# Patient Record
Sex: Male | Born: 1970 | Race: White | Hispanic: No | Marital: Married | Smoking: Never smoker
Health system: Southern US, Community
[De-identification: ages and names within clinical notes are randomized; demographics above are authoritative.]

## PROBLEM LIST (undated history)

## (undated) DIAGNOSIS — R9431 Abnormal electrocardiogram [ECG] [EKG]: Secondary | ICD-10-CM

## (undated) DIAGNOSIS — F419 Anxiety disorder, unspecified: Secondary | ICD-10-CM

## (undated) DIAGNOSIS — E785 Hyperlipidemia, unspecified: Secondary | ICD-10-CM

## (undated) DIAGNOSIS — D229 Melanocytic nevi, unspecified: Secondary | ICD-10-CM

## (undated) DIAGNOSIS — I10 Essential (primary) hypertension: Secondary | ICD-10-CM

## (undated) DIAGNOSIS — G4730 Sleep apnea, unspecified: Secondary | ICD-10-CM

## (undated) HISTORY — DX: Hyperlipidemia, unspecified: E78.5

## (undated) HISTORY — PX: FINGER SURGERY: SHX640

## (undated) HISTORY — DX: Anxiety disorder, unspecified: F41.9

## (undated) HISTORY — DX: Melanocytic nevi, unspecified: D22.9

## (undated) HISTORY — DX: Abnormal electrocardiogram (ECG) (EKG): R94.31

## (undated) HISTORY — DX: Essential (primary) hypertension: I10

## (undated) HISTORY — PX: TOOTH EXTRACTION: SUR596

## (undated) HISTORY — DX: Sleep apnea, unspecified: G47.30

---

## 2002-07-15 HISTORY — PX: VASECTOMY: SHX75

## 2003-11-30 ENCOUNTER — Encounter: Admission: RE | Admit: 2003-11-30 | Discharge: 2003-11-30 | Payer: Self-pay | Admitting: Family Medicine

## 2006-07-30 ENCOUNTER — Ambulatory Visit: Payer: Self-pay | Admitting: Family Medicine

## 2006-08-22 ENCOUNTER — Ambulatory Visit: Payer: Self-pay | Admitting: Family Medicine

## 2007-05-20 ENCOUNTER — Encounter (INDEPENDENT_AMBULATORY_CARE_PROVIDER_SITE_OTHER): Payer: Self-pay | Admitting: Family Medicine

## 2007-07-02 ENCOUNTER — Telehealth (INDEPENDENT_AMBULATORY_CARE_PROVIDER_SITE_OTHER): Payer: Self-pay | Admitting: Family Medicine

## 2007-07-06 ENCOUNTER — Ambulatory Visit: Payer: Self-pay | Admitting: Family Medicine

## 2007-09-02 ENCOUNTER — Ambulatory Visit: Payer: Self-pay | Admitting: Family Medicine

## 2007-09-02 ENCOUNTER — Encounter (INDEPENDENT_AMBULATORY_CARE_PROVIDER_SITE_OTHER): Payer: Self-pay | Admitting: *Deleted

## 2007-09-02 LAB — CONVERTED CEMR LAB: Direct LDL: 169.7 mg/dL

## 2007-09-16 ENCOUNTER — Ambulatory Visit: Payer: Self-pay | Admitting: Family Medicine

## 2008-04-06 ENCOUNTER — Ambulatory Visit: Payer: Self-pay | Admitting: Internal Medicine

## 2008-04-06 DIAGNOSIS — M549 Dorsalgia, unspecified: Secondary | ICD-10-CM

## 2008-04-06 HISTORY — DX: Dorsalgia, unspecified: M54.9

## 2008-05-19 ENCOUNTER — Ambulatory Visit: Payer: Self-pay | Admitting: Internal Medicine

## 2008-05-19 DIAGNOSIS — F411 Generalized anxiety disorder: Secondary | ICD-10-CM

## 2008-05-19 DIAGNOSIS — R9431 Abnormal electrocardiogram [ECG] [EKG]: Secondary | ICD-10-CM

## 2008-05-19 DIAGNOSIS — F419 Anxiety disorder, unspecified: Secondary | ICD-10-CM | POA: Insufficient documentation

## 2008-05-19 DIAGNOSIS — I10 Essential (primary) hypertension: Secondary | ICD-10-CM

## 2008-05-19 HISTORY — DX: Essential (primary) hypertension: I10

## 2008-05-19 HISTORY — DX: Generalized anxiety disorder: F41.1

## 2008-05-23 ENCOUNTER — Telehealth (INDEPENDENT_AMBULATORY_CARE_PROVIDER_SITE_OTHER): Payer: Self-pay | Admitting: *Deleted

## 2008-05-25 ENCOUNTER — Encounter (INDEPENDENT_AMBULATORY_CARE_PROVIDER_SITE_OTHER): Payer: Self-pay | Admitting: *Deleted

## 2008-05-30 ENCOUNTER — Telehealth (INDEPENDENT_AMBULATORY_CARE_PROVIDER_SITE_OTHER): Payer: Self-pay | Admitting: *Deleted

## 2008-06-13 ENCOUNTER — Ambulatory Visit: Payer: Self-pay | Admitting: Cardiovascular Disease

## 2008-06-23 ENCOUNTER — Ambulatory Visit: Payer: Self-pay

## 2008-06-23 ENCOUNTER — Encounter: Payer: Self-pay | Admitting: Cardiovascular Disease

## 2008-07-01 ENCOUNTER — Ambulatory Visit: Payer: Self-pay | Admitting: Cardiovascular Disease

## 2008-08-16 ENCOUNTER — Ambulatory Visit: Payer: Self-pay | Admitting: Internal Medicine

## 2008-08-18 LAB — CONVERTED CEMR LAB
Cholesterol: 256 mg/dL (ref 0–200)
HDL: 84.4 mg/dL (ref >39.0)
Triglycerides: 75 mg/dL (ref 0–149)

## 2008-09-08 ENCOUNTER — Ambulatory Visit: Payer: Self-pay | Admitting: Internal Medicine

## 2008-09-13 ENCOUNTER — Telehealth (INDEPENDENT_AMBULATORY_CARE_PROVIDER_SITE_OTHER): Payer: Self-pay | Admitting: *Deleted

## 2008-09-13 LAB — CONVERTED CEMR LAB
CO2: 30 meq/L (ref 19–32)
Calcium: 9.4 mg/dL (ref 8.4–10.5)
Creatinine, Ser: 0.9 mg/dL (ref 0.4–1.5)
GFR calc non Af Amer: 101 mL/min
Glucose, Bld: 102 mg/dL — ABNORMAL HIGH (ref 70–99)
Sodium: 143 meq/L (ref 135–145)

## 2008-11-16 ENCOUNTER — Encounter (INDEPENDENT_AMBULATORY_CARE_PROVIDER_SITE_OTHER): Payer: Self-pay | Admitting: *Deleted

## 2008-12-13 ENCOUNTER — Ambulatory Visit: Payer: Self-pay | Admitting: Internal Medicine

## 2009-02-01 ENCOUNTER — Ambulatory Visit: Payer: Self-pay | Admitting: Family Medicine

## 2009-06-05 ENCOUNTER — Ambulatory Visit: Payer: Self-pay | Admitting: Internal Medicine

## 2009-06-05 DIAGNOSIS — D239 Other benign neoplasm of skin, unspecified: Secondary | ICD-10-CM

## 2009-06-05 HISTORY — DX: Other benign neoplasm of skin, unspecified: D23.9

## 2009-06-07 ENCOUNTER — Ambulatory Visit: Payer: Self-pay | Admitting: Internal Medicine

## 2009-06-14 ENCOUNTER — Encounter: Payer: Self-pay | Admitting: Internal Medicine

## 2009-06-15 ENCOUNTER — Ambulatory Visit: Payer: Self-pay | Admitting: Cardiovascular Disease

## 2009-06-15 DIAGNOSIS — R9389 Abnormal findings on diagnostic imaging of other specified body structures: Secondary | ICD-10-CM

## 2009-06-15 DIAGNOSIS — I4510 Unspecified right bundle-branch block: Secondary | ICD-10-CM

## 2009-06-15 HISTORY — DX: Unspecified right bundle-branch block: I45.10

## 2009-06-15 HISTORY — DX: Abnormal findings on diagnostic imaging of other specified body structures: R93.89

## 2009-06-15 LAB — CONVERTED CEMR LAB
AST: 23 units/L (ref 0–37)
BUN: 11 mg/dL (ref 6–23)
Basophils Absolute: 0 10*3/uL (ref 0.0–0.1)
Chloride: 103 meq/L (ref 96–112)
Creatinine, Ser: 1 mg/dL (ref 0.4–1.5)
Direct LDL: 166.6 mg/dL
HDL: 79.9 mg/dL (ref >39.00)
Lymphocytes Relative: 24.9 % (ref 12.0–46.0)
MCHC: 34.5 g/dL (ref 30.0–36.0)
MCV: 93.9 fL (ref 78.0–100.0)
Neutro Abs: 2.6 10*3/uL (ref 1.4–7.7)
Platelets: 177 10*3/uL (ref 150.0–400.0)
RDW: 11.6 % (ref 11.5–14.6)
TSH: 2.31 microintl units/mL (ref 0.35–5.50)
WBC: 4.2 10*3/uL — ABNORMAL LOW (ref 4.5–10.5)

## 2009-06-27 ENCOUNTER — Encounter: Payer: Self-pay | Admitting: Cardiovascular Disease

## 2009-06-27 ENCOUNTER — Ambulatory Visit: Payer: Self-pay

## 2009-06-27 ENCOUNTER — Ambulatory Visit: Payer: Self-pay | Admitting: Cardiovascular Disease

## 2009-06-27 ENCOUNTER — Ambulatory Visit (HOSPITAL_COMMUNITY): Admission: RE | Admit: 2009-06-27 | Discharge: 2009-06-27 | Payer: Self-pay | Admitting: Internal Medicine

## 2009-07-18 ENCOUNTER — Telehealth: Payer: Self-pay | Admitting: Cardiovascular Disease

## 2009-12-15 ENCOUNTER — Telehealth (INDEPENDENT_AMBULATORY_CARE_PROVIDER_SITE_OTHER): Payer: Self-pay | Admitting: *Deleted

## 2009-12-28 ENCOUNTER — Encounter (INDEPENDENT_AMBULATORY_CARE_PROVIDER_SITE_OTHER): Payer: Self-pay | Admitting: *Deleted

## 2010-02-21 ENCOUNTER — Ambulatory Visit: Payer: Self-pay | Admitting: Internal Medicine

## 2010-03-23 ENCOUNTER — Ambulatory Visit: Payer: Self-pay | Admitting: Internal Medicine

## 2010-06-19 ENCOUNTER — Encounter: Payer: Self-pay | Admitting: Cardiovascular Disease

## 2010-06-19 ENCOUNTER — Ambulatory Visit: Payer: Self-pay | Admitting: Cardiovascular Disease

## 2010-08-12 LAB — CONVERTED CEMR LAB
Basophils Absolute: 0 10*3/uL (ref 0.0–0.1)
Basophils Relative: 0.4 % (ref 0.0–3.0)
Direct LDL: 183.5 mg/dL
Eosinophils Absolute: 0 10*3/uL (ref 0.0–0.7)
GFR calc non Af Amer: 101 mL/min
Glucose, Bld: 92 mg/dL (ref 70–99)
HDL: 65.4 mg/dL (ref >39.0)
Hemoglobin: 13.9 g/dL (ref 13.0–17.0)
Lymphocytes Relative: 20 % (ref 12.0–46.0)
Monocytes Absolute: 0.4 10*3/uL (ref 0.1–1.0)
Monocytes Relative: 6.9 % (ref 3.0–12.0)
Neutro Abs: 4.6 10*3/uL (ref 1.4–7.7)
Neutrophils Relative %: 71.9 % (ref 43.0–77.0)
Total CHOL/HDL Ratio: 4
Triglycerides: 53 mg/dL (ref 0–149)
VLDL: 11 mg/dL (ref 0–40)
WBC: 6.2 10*3/uL (ref 4.5–10.5)

## 2010-08-14 NOTE — Assessment & Plan Note (Signed)
Summary: 1 yr   Visit Type:  1 yr f/u Primary Provider:  Nolon Rod. Paz MD  CC:  denies any cardiac complaints today.  History of Present Illness: Mr. Troy Leonard is a pleasant 40 year old Caucasian male with a past medical history significant for anxiety, borderline hyperlipidemia and borderline hypertension who was initially seen in our office on June 13, 2008 for further evaluation of an abnormal EKG.  The patient at that time denied having any chest pain, but did complain of occasional shortness of breath when he was having episodes of anxiety.  His EKG in our office showed a normal conduction pattern, but did show  left ventricular hypertrophy. Echo showed mild LV and RV dilation, normal LV function,  trivial AI. I last saw him in December 2010.  Echo at that time showed normal LV and RV size and function. He returns today for follow up. He has no complaints. He denies SOB or chest pain. He has been exercising daily.   Current Medications (verified): 1)  Lotensin 20 Mg Tabs (Benazepril Hcl) .Marland Kitchen.. 1 By Mouth Once Daily 2)  Naproxen 500 Mg Tabs (Naproxen) .Marland Kitchen.. 1 By Mouth Two Times A Day With Food  As Needed For Pain 3)  Flexeril 10 Mg Tabs (Cyclobenzaprine Hcl) .Marland Kitchen.. 1 By Mouth At Bedtime As Needed Pain 4)  Clonazepam 2 Mg Tabs (Clonazepam) .Marland Kitchen.. 1 Tab Once Daily 5)  Multivitamins   Tabs (Multiple Vitamin) .Marland Kitchen.. 1 Tab Once Daily 6)  Fish Oil 1000 Mg Caps (Omega-3 Fatty Acids) .... 2-3 Caps Once Daily 7)  Aspirin 81 Mg Tbec (Aspirin) .... As Needed 8)  Glucosamine 500 Mg Caps (Glucosamine Sulfate) .... 2 Caps Once Daily  Allergies (verified): No Known Drug Allergies  Past History:  Past Medical History: ? of HYPERLIPIDEMIA, WITH HIGH HDL   Anxiety sees [name] 11-09 EKG LVH, saw cards , ECHO done ( see report), repeat ECHO 05-2009 NEVI, MULTIPLE   HYPERTENSION      Social History: Reviewed history from 06/13/2008 and no changes required. used to play soccer at [employer] Married 2 kids  No tobacco No illicit drugs Daily wine-2-3 glasses employed as a Public affairs consultant  Review of Systems  The patient denies fatigue, malaise, fever, weight gain/loss, vision loss, decreased hearing, hoarseness, chest pain, palpitations, shortness of breath, prolonged cough, wheezing, sleep apnea, coughing up blood, abdominal pain, blood in stool, nausea, vomiting, diarrhea, heartburn, incontinence, blood in urine, muscle weakness, joint pain, leg swelling, rash, skin lesions, headache, fainting, dizziness, depression, anxiety, enlarged lymph nodes, easy bruising or bleeding, and environmental allergies.    Vital Signs:  Patient profile:   40 year old male Height:      74 inches Weight:      189 pounds BMI:     24.35 Pulse rate:   77 / minute Pulse rhythm:   regular BP sitting:   122 / 70  (left arm) Cuff size:   large  Vitals Entered By: Danielle Rankin, CMA (June 19, 2010 8:44 AM)  Physical Exam  General:  General: Well developed, well nourished, NAD Musculoskeletal: Muscle strength 5/5 all ext Psychiatric: Mood and affect normal Neck: No JVD, no carotid bruits, no thyromegaly, no lymphadenopathy. Lungs:Clear bilaterally, no wheezes, rhonci, crackles CV: RRR no murmurs, gallops rubs Abdomen: soft, NT, ND, BS present Extremities: No edema, pulses 2+.    EKG  Procedure date:  06/19/2010  Findings:      NSR, rate 77 bpm. incomplete RBBB  Impression & Recommendations:  Problem # 1:  RBBB (ICD-426.4) Normal LV and RV by echo one year ago. I will see him back in 2 years and repeat echo after that.   His updated medication list for this problem includes:    Lotensin 20 Mg Tabs (Benazepril hcl) .Marland Kitchen... 1 by mouth once daily    Aspirin 81 Mg Tbec (Aspirin) .Marland Kitchen... As needed  Problem # 2:  HYPERTENSION (ICD-401.9) BP well controlled. No change in therapy.   His updated medication list for this problem includes:    Lotensin 20 Mg Tabs  (Benazepril hcl) .Marland Kitchen... 1 by mouth once daily    Aspirin 81 Mg Tbec (Aspirin) .Marland Kitchen... As needed  Patient Instructions: 1)  Your physician recommends that you schedule a follow-up appointment in: 2 years 2)  Your physician recommends that you continue on your current medications as directed. Please refer to the Current Medication list given to you today.

## 2010-08-14 NOTE — Consult Note (Signed)
Summary: moles, Rx observation--- Dermatology  Duard Larsen MD Dermatology   Imported By: Lanelle Bal 08/24/2009 15:40:06  _____________________________________________________________________  External Attachment:    Type:   Image     Comment:   External Document

## 2010-08-14 NOTE — Progress Notes (Signed)
Summary: lab appt-lm  ---- Converted from flag ---- ---- 11/30/2009 10:39 AM, Shary Decamp wrote: needs nmr ------------------------------  Phone Note Outgoing Call   Call placed by: Doristine Devoid,  December 15, 2009 10:30 AM Call placed to: Patient Summary of Call: left msg w/ wife to have patient return call needs lab appt for NMR per last lab from 06/2009  Follow-up for Phone Call        mailed .....Marland KitchenMarland KitchenDoristine Devoid  December 28, 2009 3:10 PM

## 2010-08-14 NOTE — Letter (Signed)
Summary: Primary Care Appointment Letter  [facility] [place]  [address] [place], [place] [address]   Phone: [phone]  Fax: [phone]    12/28/2009 MRN: [id number]  Troy Leonard [address] [place], [place]  [address]  Dear Mr. Troy Leonard,   Your Primary Care Physician Somerville E. Paz MD has indicated that:    _______it is time to schedule an appointment.    _______you missed your appointment on______ and need to call and          reschedule.    ___X____you need to have lab work done for fasting NMR.    _______you need to schedule an appointment discuss lab or test results.    _______you need to call to reschedule your appointment that is                       scheduled on _________.     Please call our office as soon as possible. Our phone number is [phone]_____. Our office is open 8a-5p, Monday through Friday.     Thank you,     Primary Care Scheduler

## 2010-08-14 NOTE — Progress Notes (Signed)
Summary: returning call   Phone Note Call from Patient Call back at Work Phone [phone]   Caller: Patient Reason for Call: Talk to Nurse Summary of Call: returning call Initial call taken by: Migdalia Dk,  July 18, 2009 10:09 AM  Follow-up for Phone Call        LM on private cell phone with results of normal echo. Follow-up by: Suzan Garibaldi RN

## 2010-08-14 NOTE — Assessment & Plan Note (Signed)
Summary: back pain/cbs   Vital Signs:  Patient profile:   40 year old male Height:      74 inches Weight:      184.38 pounds BMI:     23.76 Pulse rate:   73 / minute Pulse rhythm:   regular BP sitting:   132 / 80  (left arm) Cuff size:   large  Vitals Entered By: Army Fossa CMA (March 23, 2010 12:40 PM) CC: Lower back pain Comments x 4-6 weeks concerend about a pinched nerve. [facility] [address]   History of Present Illness: has back pain from time to time, current symptoms started a few weeks ago after he play  soccer Pain is mostly on the left side of the lower back, somehow radiates to both legs, lateral aspect. Worse by sitting in the car for long hours  ROS No fever No bladder or bowel incontinence No lower extremity paresthesias No rash  Current Medications (verified): 1)  Lotensin 20 Mg Tabs (Benazepril Hcl) .Marland Kitchen.. 1 By Mouth Once Daily  Allergies (verified): No Known Drug Allergies  Past History:  Past Medical History: ? of HYPERLIPIDEMIA, WITH HIGH HDL   Anxiety sees [name] 11-09 EKG LVH, saw cards , ECHO done ( see report), repeat ECHO 05-2009 NEVI, MULTIPLE   HYPERTENSION  theANXIETY     Physical Exam  General:  alert, well-developed, and well-nourished.   Extremities:  no lower extremity venous Neurologic:  alert & oriented X3, strength normal in all extremities, and gait normal.  DTRs symmetrically decreased. Straight leg test negative   Impression & Recommendations:  Problem # 1:  BACK PAIN (ICD-724.5) episode of back pain for few weeks, no red flag symptoms Conservative treatment, see instructions His updated medication list for this problem includes:    Naproxen 500 Mg Tabs (Naproxen) .Marland Kitchen... 1 by mouth two times a day with food  as needed for pain    Flexeril 10 Mg Tabs (Cyclobenzaprine hcl) .Marland Kitchen... 1 by mouth at bedtime as needed pain  Complete Medication List: 1)  Lotensin 20 Mg Tabs (Benazepril hcl) .Marland Kitchen.. 1 by mouth  once daily 2)  Naproxen 500 Mg Tabs (Naproxen) .Marland Kitchen.. 1 by mouth two times a day with food  as needed for pain 3)  Flexeril 10 Mg Tabs (Cyclobenzaprine hcl) .Marland Kitchen.. 1 by mouth at bedtime as needed pain  Other Orders: Admin 1st Vaccine (04540) Flu Vaccine 33yrs + (98119)  Patient Instructions: 1)  warm compress 2)  naproxen --- watch for stomach irritation 3)  flexeril-- watch for somnolence 4)  call if no better in 2 weeks  Prescriptions: FLEXERIL 10 MG TABS (CYCLOBENZAPRINE HCL) 1 by mouth at bedtime as needed pain  #20 x 0   Entered and Authorized by:   Nolon Rod. Paz MD   Signed by:   Nolon Rod. Paz MD on 03/23/2010   Method used:   [facility]        [facility] [phone]* (retail)       [address]       [place], [place]  [address]       Ph: [phone]       Fax: [phone]   RxID:   [id number] NAPROXEN 500 MG TABS (NAPROXEN) 1 by mouth two times a day with food  as needed for pain  #40 x 0   Entered and Authorized by:   Nolon Rod. Paz MD   Signed by:  Jose E. Paz MD on 03/23/2010   Method used:   [facility]ronically to        [facility] [phone]* (retail)       [address]       [place], [place]  [address]       Ph: [phone]       Fax: [phone]   RxID:   [id number]   Flu Vaccine Consent Questions     Do you have a history of severe allergic reactions to this vaccine? no    Any prior history of allergic reactions to egg and/or gelatin? no    Do you have a sensitivity to the preservative Thimersol? no    Do you have a past history of Guillan-Barre Syndrome? no    Do you currently have an acute febrile illness? no    Have you ever had a severe reaction to latex? no    Vaccine information given and explained to patient? yes    Are you currently pregnant? no    Lot Number:[id number]   Exp Date:01/12/2011   Site Given  Right Deltoid IMu

## 2010-08-20 ENCOUNTER — Encounter: Payer: Self-pay | Admitting: Internal Medicine

## 2010-08-20 ENCOUNTER — Ambulatory Visit (INDEPENDENT_AMBULATORY_CARE_PROVIDER_SITE_OTHER): Payer: Self-pay | Admitting: Internal Medicine

## 2010-08-20 DIAGNOSIS — M6580 Other synovitis and tenosynovitis, unspecified site: Secondary | ICD-10-CM

## 2010-08-23 ENCOUNTER — Telehealth: Payer: Self-pay | Admitting: Internal Medicine

## 2010-08-30 NOTE — Progress Notes (Signed)
Summary: Voltaren not working  Phone Note Call from Patient Call back at Work Phone [phone]   Summary of Call: Patient left message on triage that this is his 3rd day on Voltaren and it is not working. He would like to know if this can be switched to the topical ointment. Please advise. Lucious Groves CMA  August 23, 2010 3:33 PM   Follow-up for Phone Call        ok, see Rx Salvatore Shear E. Jarius Dieudonne MD  August 23, 2010 3:37 PM   Additional Follow-up for Phone Call Additional follow up Details #1::        Left message on voicemail to call back to office. Lucious Groves CMA  August 23, 2010 4:30 PM   Patient notified. Lucious Groves CMA  August 24, 2010 9:08 AM     New/Updated Medications: VOLTAREN 1 % GEL (DICLOFENAC SODIUM) apply 1gr three times a day to each joint as needed Prescriptions: VOLTAREN 1 % GEL (DICLOFENAC SODIUM) apply 1gr three times a day to each joint as needed  #1 month x 3   Entered and Authorized by:   Elita Quick E. Siegfried Vieth MD   Signed by:   Lucious Groves CMA on 08/23/2010   Method used:   [facility] to        [facility] [phone]* (retail)       [address]       [place], [place]  [address]       Ph: [phone]       Fax: [phone]   RxID:   [id number]

## 2010-08-30 NOTE — Assessment & Plan Note (Signed)
Summary: BOTH KNEES PAINFUL///SPH  Nurse Visit   Vital Signs:  Patient profile:   40 year old male Weight:      192.50 pounds Pulse rate:   68 / minute Pulse rhythm:   regular BP sitting:   122 / 84  (left arm) Cuff size:   large  Vitals Entered By: Army Fossa CMA (August 20, 2010 3:39 PM)  History of Present Illness: started to run 11-2009  ~ 2 or 3 miles then in November started to run more  ~ 5 or 6 miles. His knees started to hurt, initiall the L , now both (L>R) pain is at the lateral asprect and on the right side of the frontal lower aspect of the knee voltaren  from his wife helps  ROS denies  swelling No locking or giving away tried  Voltaren without apparent GI  side effects   Physical Exam  General:  alert, well-developed, and well-nourished.   Extremities:  knees are symmetric, no swelling, redness, deformities. Both knees are stable and full range of motion   Impression & Recommendations:  Problem # 1:  TENDINITIS, KNEE (ICD-727.09) symptoms consistent with knee tendinitis  Stretching discussed and encouraged before running NSAIDs ice Recommend to gradually go back to exercise Will call if no better  Complete Medication List: 1)  Lotensin 20 Mg Tabs (Benazepril hcl) .Marland Kitchen.. 1 by mouth once daily 2)  Naproxen 500 Mg Tabs (Naproxen) .Marland Kitchen.. 1 by mouth two times a day with food  as needed for pain 3)  Clonazepam 2 Mg Tabs (Clonazepam) .Marland Kitchen.. 1 tab once daily 4)  Multivitamins Tabs (Multiple vitamin) .Marland Kitchen.. 1 tab once daily 5)  Fish Oil 1000 Mg Caps (Omega-3 fatty acids) .... 2-3 caps once daily 6)  Aspirin 81 Mg Tbec (Aspirin) .... As needed 7)  Glucosamine 500 Mg Caps (Glucosamine sulfate) .... 2 caps once daily 8)  Diclofenac Sodium 50 Mg Tbec (Diclofenac sodium) .Marland Kitchen.. 1 by mouth two times a day as needed pain   Patient Instructions: 1)  stretching before running 2)  ice 3)  voltaren (diclofenac) as needed , take with food  4)  call if not feeling  well    Past History:  Past Medical History: Reviewed history from 06/19/2010 and no changes required. ? of HYPERLIPIDEMIA, WITH HIGH HDL   Anxiety sees [name] 11-09 EKG LVH, saw cards , ECHO done ( see report), repeat ECHO 05-2009 NEVI, MULTIPLE   HYPERTENSION      Past Surgical History: Reviewed history from 06/13/2008 and no changes required. Finger surgery   Social History: Reviewed history from 06/13/2008 and no changes required. used to [place] [employer] Married 2 kids  No tobacco No illicit drugs Daily wine-2-3 glasses employed as a Transport planner for [employer]  CC: Pain in both knees Comments started thanksgiving off and on (L) knee is worse [facility] [address]   Current Medications (verified): 1)  Lotensin 20 Mg Tabs (Benazepril Hcl) .Marland Kitchen.. 1 By Mouth Once Daily 2)  Naproxen 500 Mg Tabs (Naproxen) .Marland Kitchen.. 1 By Mouth Two Times A Day With Food  As Needed For Pain 3)  Clonazepam 2 Mg Tabs (Clonazepam) .Marland Kitchen.. 1 Tab Once Daily 4)  Multivitamins   Tabs (Multiple Vitamin) .Marland Kitchen.. 1 Tab Once Daily 5)  Fish Oil 1000 Mg Caps (Omega-3 Fatty Acids) .... 2-3 Caps Once Daily 6)  Aspirin 81 Mg Tbec (Aspirin) .... As Needed 7)  Glucosamine 500 Mg Caps (Glucosamine Sulfate) .... 2 Caps  Once Daily  Allergies (verified): No Known Drug Allergies  Orders Added: 1)  Est. Patient Level III [[address]] Prescriptions: DICLOFENAC SODIUM 50 MG TBEC (DICLOFENAC SODIUM) 1 by mouth two times a day as needed pain  #60 x 0   Entered and Authorized by:   Nolon Rod. Rorey Hodges MD   Signed by:   Nolon Rod. Mickie Badders MD on 08/20/2010   Method used:   [facility] to        [facility] [phone]* (retail)       [address]       [place], [place]  [address]       Ph: [phone]       Fax: [phone]   RxID:   [id number]

## 2010-09-20 ENCOUNTER — Telehealth: Payer: Self-pay | Admitting: Internal Medicine

## 2010-09-21 ENCOUNTER — Telehealth: Payer: Self-pay | Admitting: Internal Medicine

## 2010-09-25 NOTE — Progress Notes (Addendum)
Summary: Pennsaid directions  Phone Note From Pharmacy   Caller: [facility]  Summary of Call: Pharmacist called and states that Pennsaid comes in ML not grams- please give new directions. Army Fossa CMA  September 21, 2010 2:10 PM 4cc on each knee qid as needed [place] E. Paz MD  September 21, 2010 2:37 PM      New/Updated Medications: PENNSAID 1.5 % SOLN (DICLOFENAC SODIUM) 4cc on each knee qid as needed Prescriptions: PENNSAID 1.5 % SOLN (DICLOFENAC SODIUM) 4cc on each knee qid as needed  #1 x 3   Entered by:   Army Fossa CMA   Authorized by:   Nolon Rod. Paz MD   Signed by:   Army Fossa CMA on 09/21/2010   Method used:   [facility]ically to        [facility] [phone]* (retail)       [address]       [place], [place]  [address]       Ph: [phone]       Fax: [phone]   RxID:   [id number]   Appended Document: [facility]aid [facility] and the pharmacist stated the directions on this RX should be 40 drops to each knee qid and wanted to know if it was ok to change. She stated this is the recommended dose and 4cc's would be 80 drops.   Appended Document: Pennsaid directions ok to change  Appended Document: Pennsaid directions done.

## 2010-09-25 NOTE — Progress Notes (Signed)
Summary: voltaren alt  Phone Note From Pharmacy   Summary of Call: Voltaren gel is on indefenite backorder. Is there an alternative for pt ?  [facility] Initial call taken by: Army Fossa CMA,  September 20, 2010 9:56 AM  Follow-up for Phone Call        i don't believe there is another  prescribed topical NSAID, rec to ask his pharmacyst. ? OTC topical ibuprofen  Follow-up by: Nolon Rod. Francisca Harbuck MD,  September 20, 2010 6:39 PM  Additional Follow-up for Phone Call Additional follow up Details #1::        Pharmacist suggested Pennsaid. Army Fossa CMA  September 21, 2010 7:58 AM OK: Pennsaid 2gr on each knee qid as needed  Jessye Imhoff E. Negar Sieler MD  September 21, 2010 10:22 AM     New/Updated Medications: PENNSAID 1.5 % SOLN (DICLOFENAC SODIUM) 2gr on each knee qid as needed Prescriptions: PENNSAID 1.5 % SOLN (DICLOFENAC SODIUM) 2gr on each knee qid as needed  #1 x 3   Entered by:   Army Fossa CMA   Authorized by:   Nolon Rod. Gianina Olinde MD   Signed by:   Army Fossa CMA on 09/21/2010   Method used:   [facility] to        [facility] [address] [phone]* (retail)       [address]       [place], [place]  [address]       Ph: [phone] or [phone]       Fax: [phone]   RxID:   [id number]

## 2010-11-27 NOTE — Assessment & Plan Note (Signed)
[facility]                            CARDIOLOGY OFFICE NOTE   Troy Leonard, Troy Leonard                         MRN:          [id number]  DATE:06/13/2008                            DOB:          06/08/1971    PRIMARY CARE PHYSICIAN:  Willow Ora, MD   REASON FOR CONSULTATION:  Abnormal EKG.   HISTORY OF PRESENT ILLNESS:  Troy Leonard is a pleasant 40 year old  Caucasian male with a past medical history significant for anxiety,  borderline hyperlipidemia, and borderline hypertension who comes in  today as a referral from Dr. Drue Novel for further evaluation of an abnormal  EKG.  The patient tells me that he has been in his normal state of good  health and has been exercising on a daily basis without any complaints  of chest discomfort.  He does state that when he becomes anxious, he has  a sensation of not in his substernal area.  This is not described as a  chest pain and does not radiate.  There is no associated diaphoresis,  palpitations, nausea, shortness of breath, or dizziness.  The patient  also notes that when he feels anxious, he occasionally feels short of  breath as well.  He exercises on a daily basis for up to 1.5 hours and  has no complaints of shortness of breath or chest pain while doing so.  He also denies any orthopnea, PND, near syncope, syncope, or lower  extremity edema.  He tells me that his mother was diagnosed with an  idiopathic cardiomyopathy and because of this, he would like a cardiac  evaluation.  He was seen in the office of Dr. Drue Novel on May 19, 2008,  at which time an EKG showed sinus rhythm with right bundle-branch block.   PAST MEDICAL HISTORY:  1. Anxiety.  2. Borderline hyperlipidemia.  3. Borderline hypertension.   PAST SURGICAL HISTORY:  Finger surgery.   ALLERGIES:  No known drug allergies.   CURRENT MEDICATIONS:  Zoloft 100 mg once daily.   SOCIAL HISTORY:  The patient denies the use of tobacco or illicit drugs.  He  has several glasses of wine per day.  He is married and has two  children.  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   FAMILY HISTORY:  The patient's mother has what sounds to be an  idiopathic cardiomyopathy.  She is 40 years old and takes medications  for this only.  His father is 63 and is healthy.  He has one sister who  is 62 and is healthy.  There is no family history of coronary artery  disease or sudden cardiac death.   REVIEW OF SYSTEMS:  As stated in the history of present illness and is  otherwise negative.   PHYSICAL EXAMINATION:  VITALS:  Blood pressure 146/90, pulse 71 and  regular, respirations 12 and nonlabored.  GENERAL:  He is a pleasant young Caucasian male in no acute distress.  He is alert and oriented x3.  HEENT:  Normal.  PSYCHIATRIC:  Mood and affect are normal.  MUSCULOSKELETAL:  Muscle strength and tone is normal.  NEUROLOGICAL:  No focal neurological deficits.  SKIN:  Warm and dry.  NECK:  No JVD.  No carotid bruits.  No thyromegaly.  No lymphadenopathy.  LUNGS:  Clear to auscultation bilaterally without wheezes, rhonchi, or  crackles noted.  CARDIOVASCULAR:  Regular rate and rhythm without murmurs, gallops, or  rubs noted.  ABDOMEN:  Soft, nontender.  Bowel sounds are present.  EXTREMITIES:  No evidence of edema.  Pulses are 2+ in all extremities.   DIAGNOSTIC STUDIES:  A 12-lead EKG obtained in our office today shows  sinus rhythm with a ventricular rate of 71 beats per minute.  There is  minimal voltage criteria for LVH.  There are no conduction defects  noted.  The corrected QT interval is 458 milliseconds.   ASSESSMENT AND PLAN:  This is a pleasant 40 year old Caucasian male with  past medical history significant for borderline hypertension and  borderline hyperlipidemia, as well as anxiety, and a family history of  an idiopathic cardiomyopathy in his mother who presents today for  further cardiac evaluation after being found to  have right bundle-branch  block on his EKG in his primary care physician's office.  There is no  evidence of a right bundle-branch block on the EKG today; however, there  is evidence of mild left ventricular hypertrophy.  I have discussed this  with the patient and feel it would be most appropriate at this time to  proceed to surface echocardiogram to assess his left ventricular  function as well as to look at his overall left ventricular size and  wall thickness.  We will perform this later this week and see the  patient back in the office in several weeks to review the results of his  echocardiogram.  I have encouraged him to continue to follow with Dr.  Drue Novel for his primary care needs.     Verne Carrow, MD  Electronically Signed    CM/MedQ  DD: 06/13/2008  DT: 06/14/2008  Job #: [id number]   cc:   Willow Ora, MD

## 2010-11-27 NOTE — Assessment & Plan Note (Signed)
[facility]                            CARDIOLOGY OFFICE NOTE   ZUHAYR, DEENEY                         MRN:          [id number]  DATE:07/01/2008                            DOB:          [birth year]    PRIMARY CARE PHYSICIAN:  Willow Ora, MD   REASON FOR VISIT:  Followup of echocardiogram after initial visit in  this office on June 13, 2008.   HISTORY OF PRESENT ILLNESS:  Mr. Troy Leonard is a pleasant 40 year old  Caucasian male with a past medical history significant for anxiety,  borderline hyperlipidemia, borderline hypertension who was initially  seen in our office on June 13, 2008 for further evaluation of an  abnormal EKG.  The patient at that time denied having any chest pain,  but did complain of occasional shortness of breath when he was having  episodes of anxiety.  His EKG in our office showed a normal conduction  pattern, but did show a left ventricular hypertrophy.  We elected to  pursue having an echocardiogram.  He returns today to follow up on the  results of his echocardiogram.   He tells me that he has been doing well.  He denies having had any  episodes of chest pain, shortness of breath, dizziness, near syncope,  syncope, orthopnea, PND, lower extremity edema, diaphoresis, nausea, or  vomiting.  He has been checking his blood pressures at home and notes  that his systolic blood pressure has been in the 140s consistently.  He  also brings him with him today, recent fasting lipid profiles.   PAST MEDICAL HISTORY:  1. Anxiety.  2. Borderline hypertension.  3. Hyperlipidemia.   CURRENT MEDICATIONS:  Zoloft 100 mg once daily.   REVIEW OF SYSTEMS:  As stated in history present illness, is otherwise  negative.   PHYSICAL EXAMINATION:  VITAL SIGNS:  Blood pressure 120/90, pulse 60 and  regular, respirations 12 and unlabored.  GENERAL:  He is a pleasant, young, Caucasian male, in no acute distress.  He is alert and oriented  x3.  SKIN:  Warm and dry.  NECK:  No JVD.  No carotid bruits.  No thyromegaly.  No lymphadenopathy.  LUNGS:  Clear to auscultation bilaterally without wheezes, rhonchi, or  crackles noted.  CARDIOVASCULAR:  Regular rate and rhythm without murmurs, gallops, or  rubs noted.  ABDOMEN:  Soft, nontender, and nondistended.  Bowel sounds are present.  EXTREMITIES:  No evidence of edema.  Pulses are 2+ in all extremities.   DIAGNOSTIC STUDIES:  1. Transthoracic echocardiogram obtained on June 23, 2008, shows      that the left ventricle was mildly dilated with overall normal left      ventricular systolic and diastolic function.  Ejection fraction is      estimated at 55%.  There is possible mild mid-to-distal inferior      hypokinesis.  The right ventricle was moderately dilated with      normal function.  There are no signs of left ventricle non-      compaction should.  Aortic valve thickness is mildly increased.  There is trivial aortic valvular regurgitation.  Left atrial sizes      is at the upper limits of normal.  The estimated peak pulmonary      artery systolic pressure was 30 mmHg.   ASSESSMENT AND PLAN:  This is a pleasant 40 year old Caucasian male with  a history of borderline hypertension, borderline hyperlipidemia, and  anxiety who was initially referred to our office for evaluation of an  abnormal EKG that showed right bundle-branch block.  His EKG in our  office shows normal conduction through the ventricles without any  evidence of right bundle-branch block.  EKG does have changes consistent  with mild LVH.  His echocardiogram shows mild enlargement of the left  ventricle, right ventricle, and left atrium.  There is overall normal  left ventricular systolic function.  While these findings on  echocardiogram could represent an early cardiomyopathy, the patient is  currently asymptomatic and I think it would be appropriate to repeat an  echocardiogram in 1 year.  At  that time, we may also consider performing  a cardiac MRI to assess his left and right ventricles.  In regards to  his blood pressure, it is well controlled today.  However, he does tell  me that it has been in the 140s and 150s at home.  His total cholesterol  is also elevated with an LDL of 183.  His HDL was 65.  I have encouraged  him to follow with Dr. Drue Novel and have further discussions about the  possibility of starting therapy for his blood pressure control, as well  as medication for his elevated cholesterol level.  I spent 15 minutes  today discussing the risk factors of coronary artery disease and  specifically talked about lowering his cholesterol and having good  control of his blood pressure.  The patient will take this into  consideration and discuss this further with his primary care physician  Dr. Drue Novel.  I would like to see him back in this office in 1 year, at  which time we will perform an echocardiogram.  The patient is aware that  he should alert [place] if he has any change in his clinical status over the  next year.     Verne Carrow, MD  Electronically Signed    CM/MedQ  DD: 07/01/2008  DT: 07/01/2008  Job #: [id number]

## 2010-12-23 ENCOUNTER — Other Ambulatory Visit: Payer: Self-pay | Admitting: Internal Medicine

## 2011-01-26 ENCOUNTER — Other Ambulatory Visit: Payer: Self-pay | Admitting: Internal Medicine

## 2011-02-15 ENCOUNTER — Other Ambulatory Visit: Payer: Self-pay | Admitting: Internal Medicine

## 2011-02-15 NOTE — Telephone Encounter (Signed)
LK x1; label as twice a day as needed only; take with food

## 2011-02-15 NOTE — Telephone Encounter (Signed)
Last refilled- 06/28/10 #60, no rfs , last Ov- 08/20/10.

## 2011-03-05 ENCOUNTER — Other Ambulatory Visit: Payer: Self-pay | Admitting: Internal Medicine

## 2011-03-05 NOTE — Telephone Encounter (Signed)
Rx Done . 

## 2011-04-03 ENCOUNTER — Other Ambulatory Visit: Payer: Self-pay | Admitting: Internal Medicine

## 2011-05-20 ENCOUNTER — Ambulatory Visit (INDEPENDENT_AMBULATORY_CARE_PROVIDER_SITE_OTHER): Payer: BC Managed Care – PPO | Admitting: Internal Medicine

## 2011-05-20 ENCOUNTER — Encounter: Payer: Self-pay | Admitting: Internal Medicine

## 2011-05-20 VITALS — BP 128/63 | HR 73 | Temp 98.0°F | Resp 18 | Ht 74.02 in | Wt 189.4 lb

## 2011-05-20 DIAGNOSIS — N508 Other specified disorders of male genital organs: Secondary | ICD-10-CM

## 2011-05-20 DIAGNOSIS — N503 Cyst of epididymis: Secondary | ICD-10-CM | POA: Insufficient documentation

## 2011-05-20 DIAGNOSIS — I10 Essential (primary) hypertension: Secondary | ICD-10-CM

## 2011-05-20 HISTORY — DX: Cyst of epididymis: N50.3

## 2011-05-20 NOTE — Patient Instructions (Signed)
Came back for a physical within 3 - 4 weeks, fasting

## 2011-05-20 NOTE — Assessment & Plan Note (Signed)
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 In light that this was documented by ultrasound 10  years ago and has not changed in size , I reassured the patient, encouraged to continue self testicular exam and for now will prescribe observation. I did tell him that if he is anxious about it, I will order ultrasound.

## 2011-05-20 NOTE — Assessment & Plan Note (Signed)
seems well controlled, no change. He is overdue for a BMP, will do when he comes back.

## 2011-05-20 NOTE — Progress Notes (Signed)
  Subjective:    Patient ID: Troy Leonard, male    DOB: Oct 26, 1970, 40 y.o.   MRN: [id number]  HPI Here because he is concerned about a "water sack" they found at a urgent or 10 years ago, a ultrasound confirmed the benign nature of the finding. Since then, the area has not changed in size, is still tender.  Past Medical History  Diagnosis Date  . Hyperlipidemia   . Anxiety     sees [name]  . Hypertension   . Multiple pigmented nevi   . Abnormal EKG    Past Surgical History  Procedure Date  . Finger surgery       Review of Systems Denies any dysuria or gross hematuria He has a long history of hypertension, good medication compliance, he is doing more exercise now and wonders if he could discontinue his BP meds. He is also to return 48, wonders if he needs a physical    Objective:   Physical Exam  Constitutional: He is oriented to person, place, and time. He appears well-developed and well-nourished. No distress.  HENT:  Head: Normocephalic and atraumatic.  Cardiovascular: Normal rate, regular rhythm and normal heart sounds.   No murmur heard. Pulmonary/Chest: Effort normal and breath sounds normal. No respiratory distress. He has no wheezes. He has no rales.  Abdominal: Soft. Bowel sounds are normal. He exhibits no distension. There is no tenderness. There is no rebound.  Genitourinary:          Penis normal. Testicles normal to palpation. Epididymis normal except for a soft mass, see graphic  Musculoskeletal: He exhibits no edema.  Neurological: He is alert and oriented to person, place, and time.  Skin: He is not diaphoretic.          Assessment & Plan:

## 2011-06-21 ENCOUNTER — Other Ambulatory Visit: Payer: Self-pay | Admitting: Internal Medicine

## 2011-06-21 ENCOUNTER — Ambulatory Visit (INDEPENDENT_AMBULATORY_CARE_PROVIDER_SITE_OTHER): Payer: BC Managed Care – PPO | Admitting: Internal Medicine

## 2011-06-21 DIAGNOSIS — Z0000 Encounter for general adult medical examination without abnormal findings: Secondary | ICD-10-CM

## 2011-06-21 DIAGNOSIS — Z23 Encounter for immunization: Secondary | ICD-10-CM

## 2011-06-21 DIAGNOSIS — I10 Essential (primary) hypertension: Secondary | ICD-10-CM

## 2011-06-21 LAB — CBC WITH DIFFERENTIAL/PLATELET
Basophils Absolute: 0 10*3/uL (ref 0.0–0.1)
HCT: 39.5 % (ref 39.0–52.0)
Hemoglobin: 13.7 g/dL (ref 13.0–17.0)
Lymphs Abs: 1.4 10*3/uL (ref 0.7–4.0)
MCV: 93.3 fl (ref 78.0–100.0)
Monocytes Absolute: 0.3 10*3/uL (ref 0.1–1.0)
Monocytes Relative: 7.4 % (ref 3.0–12.0)
RDW: 12.6 % (ref 11.5–14.6)
WBC: 4.5 10*3/uL (ref 4.5–10.5)

## 2011-06-21 LAB — COMPREHENSIVE METABOLIC PANEL
Albumin: 4.4 g/dL (ref 3.5–5.2)
Alkaline Phosphatase: 47 U/L (ref 39–117)
Calcium: 8.9 mg/dL (ref 8.4–10.5)
Creatinine, Ser: 0.7 mg/dL (ref 0.4–1.5)
Sodium: 141 mEq/L (ref 135–145)
Total Bilirubin: 0.6 mg/dL (ref 0.3–1.2)
Total Protein: 7.1 g/dL (ref 6.0–8.3)

## 2011-06-21 MED ORDER — BENAZEPRIL HCL 20 MG PO TABS: 20.0000 mg | ORAL_TABLET | Freq: Every day | ORAL | Status: DC

## 2011-06-21 NOTE — Assessment & Plan Note (Signed)
No amb BPs but BP today ok, no change

## 2011-06-21 NOTE — Progress Notes (Signed)
  Subjective:    Patient ID: Troy Leonard, male    DOB: 1971/05/24, 40 y.o.   MRN: [id number]  HPI CPX  Past Medical History: MILD HYPERLIPIDEMIA, WITH HIGH HDL   Anxiety sees [name] Abnormal  EKG LVH, iRBB, sees  cards , ECHO 12-10 (-), next visit  06-2012 muliple nevi, has seen derm before  Past Surgical History: Finger surgery Vasectomy 2004  Family History: cardiomyopathy ? idiopathic--M MI--no DM--no Colon ca-- GM dx age 72 Prostate ca--GF dx aprox 40 y/o  breast ca-- M Father and mother --alive, healthy No premature CAD  Social History: used to play soccer at [employer]; active Diet ok Married, 2 kids  No tobacco No illicit drugs Daily wine-2-3 glasses employed as a Transport planner for [employer]  Review of Systems  Constitutional: Negative for fever and fatigue.  Cardiovascular: Negative for chest pain and leg swelling.  Gastrointestinal: Negative for abdominal pain and blood in stool.  Genitourinary: Negative for hematuria and difficulty urinating.       Objective:   Physical Exam  Constitutional: He is oriented to person, place, and time. He appears well-developed and well-nourished. No distress.  HENT:  Head: Normocephalic and atraumatic.  Neck: No thyromegaly present.  Cardiovascular: Normal rate, regular rhythm and normal heart sounds.   No murmur heard. Pulmonary/Chest: Effort normal and breath sounds normal. No respiratory distress. He has no wheezes. He has no rales.  Abdominal: Soft. Bowel sounds are normal. He exhibits no distension. There is no tenderness. There is no rebound and no guarding.  Musculoskeletal: He exhibits no edema.  Neurological: He is alert and oriented to person, place, and time.  Skin: Skin is warm and dry. He is not diaphoretic.  Psychiatric: He has a normal mood and affect. His behavior is normal. Judgment and thought content normal.       Assessment & Plan:

## 2011-06-21 NOTE — Patient Instructions (Signed)
Check the  blood pressure 2 or 3 times a month, be sure it is less than 140/85. If it is consistently higher, let me know Next visit 1 year and as needed

## 2011-06-21 NOTE — Assessment & Plan Note (Signed)
Td-- today Had a flu shot All labs reviewed, last NMR showed improvement after he worked on life style Cont healthy life style Labs

## 2011-06-25 ENCOUNTER — Other Ambulatory Visit: Payer: Self-pay | Admitting: Internal Medicine

## 2011-06-25 NOTE — Telephone Encounter (Signed)
Per Rx on 06-21-11

## 2011-07-03 ENCOUNTER — Telehealth: Payer: Self-pay | Admitting: Internal Medicine

## 2011-07-03 ENCOUNTER — Encounter: Payer: Self-pay | Admitting: *Deleted

## 2011-07-03 NOTE — Telephone Encounter (Signed)
Advised patient, cholesterol has definitely increased. LDL used to be 125 now is 161. LDL goal is less than 130. The quality of the cholesterol has also decreased. Other labs normal. Plan: Start Lipitor 20 mg one by mouth each bedtime, #30, 6 refills. Arrange for a NMR, AST ALT in 6 weeks  send a copy of NMR to pt

## 2011-07-03 NOTE — Telephone Encounter (Signed)
Discuss with patient appt scheduled labs mailed.

## 2011-07-03 NOTE — Telephone Encounter (Signed)
Left message to call office with Pt wife.

## 2011-07-05 MED ORDER — ATORVASTATIN CALCIUM 20 MG PO TABS: 20.0000 mg | ORAL_TABLET | Freq: Every day | ORAL | Status: DC

## 2011-07-05 NOTE — Telephone Encounter (Signed)
Addended by: Candie Echevaria L on: 07/05/2011 03:57 PM   Modules accepted: Orders

## 2011-07-05 NOTE — Telephone Encounter (Signed)
Rx sent to pharmacy   

## 2011-07-15 ENCOUNTER — Encounter: Payer: Self-pay | Admitting: Internal Medicine

## 2011-07-17 ENCOUNTER — Telehealth: Payer: Self-pay | Admitting: Internal Medicine

## 2011-07-17 DIAGNOSIS — E785 Hyperlipidemia, unspecified: Secondary | ICD-10-CM

## 2011-07-17 NOTE — Telephone Encounter (Signed)
Discuss with patient, appt scheduled. 

## 2011-07-17 NOTE — Telephone Encounter (Signed)
Please advise 

## 2011-07-17 NOTE — Telephone Encounter (Signed)
Patient wants to know if he can work on diet & exercise before   taking lipitor & recheck labs in about 6 months - he has not started taking the lipitor

## 2011-07-17 NOTE — Telephone Encounter (Signed)
Yes, offer pt a nutritionist referral Labs in 6 months: FLP-- dx hyperlipidemia

## 2011-07-17 NOTE — Telephone Encounter (Signed)
Left message to call office

## 2011-08-14 ENCOUNTER — Other Ambulatory Visit: Payer: BC Managed Care – PPO

## 2011-10-31 ENCOUNTER — Telehealth: Payer: Self-pay | Admitting: Internal Medicine

## 2011-10-31 ENCOUNTER — Encounter: Payer: Self-pay | Admitting: Internal Medicine

## 2011-10-31 NOTE — Telephone Encounter (Signed)
He is due for visit with cardiology in December 2013, please be sure he has an appointment,  Why don't you refer him to the cardiology scheduler?

## 2011-10-31 NOTE — Telephone Encounter (Signed)
I have updated his family history. OK to refer to cardiologist? Please advise.

## 2011-10-31 NOTE — Telephone Encounter (Signed)
Pt would like to add to his family medical history that his mother has had mitral valve prolapse and mitral valve surgery. Pt would like a cardiology appt in Novemeber/December.

## 2011-11-01 NOTE — Telephone Encounter (Signed)
Patient returned my call, I advised him to call Cardiology and schedule his own appointment, since he is on their "Recall" list.  Patient agreed.

## 2011-11-01 NOTE — Telephone Encounter (Signed)
I left message for patient to return my call.

## 2011-11-05 ENCOUNTER — Ambulatory Visit (INDEPENDENT_AMBULATORY_CARE_PROVIDER_SITE_OTHER): Payer: BC Managed Care – PPO | Admitting: Internal Medicine

## 2011-11-05 VITALS — BP 122/70 | HR 86 | Temp 97.7°F | Wt 186.0 lb

## 2011-11-05 DIAGNOSIS — S6990XA Unspecified injury of unspecified wrist, hand and finger(s), initial encounter: Secondary | ICD-10-CM

## 2011-11-05 NOTE — Patient Instructions (Signed)
Please get your x-ray at the other [place]  office located at: [address], across from [facility].  Please go to the basement, this is a walk-in facility, they are open from 8:30 to 5:30 PM. Phone number [phone].  

## 2011-11-05 NOTE — Progress Notes (Signed)
  Subjective:    Patient ID: Troy Leonard, male    DOB: [birth year], 41 y.o.   MRN: [id number]  HPI Acute visit 2 months ago he injured his fifth right finger playing soccer (goalkeeper), since then, the pain has subsided but the PIP is swollen.  Past Medical History:  MILD HYPERLIPIDEMIA, WITH HIGH HDL  Anxiety sees [name]  Abnormal EKG LVH, iRBB, sees cards , ECHO 12-10 (-), next visit 06-2012  muliple nevi, has seen derm before  Past Surgical History:  Finger surgery  Vasectomy 2004  Family History:  cardiomyopathy ? idiopathic--M  Mitral valve prolapse repair--- Mother  MI--no  DM--no  Colon ca-- GM dx age 13  Prostate ca--GF dx aprox 41 y/o  breast ca-- M  Father and mother --alive, healthy  No premature CAD   Review of Systems No other injuries, some stiffness at injured finger mostly in the mornings His mother had MVP surgery recently, family history updated    Objective:   Physical Exam  General -- alert, well-developed, and overweight appearing. No apparent distress.  Extremities--  L hand normal R hand normal except for the 5 th finger, PIP is larger than the L one but no swollen-red-warm. Flexion is slt decreased, extension is moderately decreased. Good distal capillary refill. Psych-- Cognition and judgment appear intact. Alert and cooperative with normal attention span and concentration.  not anxious appearing and not depressed appearing.        Assessment & Plan:   Finger injury, not fully recovered after 2 months. Unable to fully extend the PIP. Plan: X-ray Hand surgery referral.

## 2011-11-06 ENCOUNTER — Encounter: Payer: Self-pay | Admitting: Internal Medicine

## 2011-11-06 ENCOUNTER — Ambulatory Visit (INDEPENDENT_AMBULATORY_CARE_PROVIDER_SITE_OTHER)
Admission: RE | Admit: 2011-11-06 | Discharge: 2011-11-06 | Disposition: A | Discharge status: Home / Self Care | Payer: BC Managed Care – PPO | Source: Ambulatory Visit | Attending: Internal Medicine | Admitting: Internal Medicine

## 2011-11-06 DIAGNOSIS — S6980XA Other specified injuries of unspecified wrist, hand and finger(s), initial encounter: Secondary | ICD-10-CM

## 2011-11-06 DIAGNOSIS — S6990XA Unspecified injury of unspecified wrist, hand and finger(s), initial encounter: Secondary | ICD-10-CM

## 2011-12-31 ENCOUNTER — Other Ambulatory Visit (INDEPENDENT_AMBULATORY_CARE_PROVIDER_SITE_OTHER): Payer: BC Managed Care – PPO

## 2011-12-31 DIAGNOSIS — E785 Hyperlipidemia, unspecified: Secondary | ICD-10-CM

## 2011-12-31 LAB — LIPID PANEL
Cholesterol: 242 mg/dL — ABNORMAL HIGH (ref 0–200)
VLDL: 14.8 mg/dL (ref 0.0–40.0)

## 2011-12-31 LAB — LDL CHOLESTEROL, DIRECT: Direct LDL: 140 mg/dL

## 2011-12-31 NOTE — Progress Notes (Signed)
Labs only

## 2012-06-18 ENCOUNTER — Encounter: Payer: Self-pay | Admitting: Cardiovascular Disease

## 2012-06-18 ENCOUNTER — Ambulatory Visit (INDEPENDENT_AMBULATORY_CARE_PROVIDER_SITE_OTHER): Payer: BC Managed Care – PPO | Admitting: Cardiovascular Disease

## 2012-06-18 VITALS — BP 122/78 | HR 66 | Resp 18 | Ht 73.0 in | Wt 189.4 lb

## 2012-06-18 DIAGNOSIS — I4510 Unspecified right bundle-branch block: Secondary | ICD-10-CM

## 2012-06-18 DIAGNOSIS — I10 Essential (primary) hypertension: Secondary | ICD-10-CM

## 2012-06-18 NOTE — Patient Instructions (Addendum)
Your physician wants you to follow-up in:  2 years. You will receive a reminder letter in the mail two months in advance. If you don't receive a letter, please call our office to schedule the follow-up appointment.  Your physician has requested that you have an echocardiogram. Echocardiography is a painless test that uses sound waves to create images of your heart. It provides your doctor with information about the size and shape of your heart and how well your heart's chambers and valves are working. This procedure takes approximately one hour. There are no restrictions for this procedure.   

## 2012-06-18 NOTE — Progress Notes (Signed)
History of Present Illness: Troy Leonard is a pleasant 41 yo Caucasian male with a past medical history significant for anxiety, borderline hyperlipidemia and borderline hypertension who was initially seen in our office on June 13, 2008 for further evaluation of an abnormal EKG. The patient at that time denied having any chest pain, but did complain of occasional shortness of breath when he was having episodes of anxiety. His EKG in our office showed a normal conduction pattern, but did show left ventricular hypertrophy. Echo showed mild LV and RV dilation, normal LV function, trivial AI. I last saw him in December 2010. Echo at that time showed normal LV and RV size and function. I last saw him in December 2011.   He returns today for follow up. He has no complaints. He denies SOB or chest pain. He has been exercising daily. He runs and plays adult soccer.  Primary Care Physician: Willow Ora  Past Medical History  Diagnosis Date  . Hyperlipidemia   . Anxiety     sees [name]  . Hypertension   . Multiple pigmented nevi   . Abnormal EKG     Past Surgical History  Procedure Date  . Finger surgery     Current Outpatient Prescriptions  Medication Sig Dispense Refill  . aspirin 81 MG tablet Take 81 mg by mouth daily.      . benazepril (LOTENSIN) 20 MG tablet Take 1 tablet (20 mg total) by mouth daily.  90 tablet  3  . clonazePAM (KLONOPIN) 2 MG tablet Take 2 mg by mouth daily as needed.        . Coenzyme Q10 (CO Q 10 PO) Take by mouth.      . Flaxseed, Linseed, (FLAX SEED OIL PO) Take by mouth.      . Multiple Vitamin (MULTIVITAMIN) tablet Take 1 tablet by mouth daily.        . [DISCONTINUED] benazepril (LOTENSIN) 20 MG tablet Take 1 tablet (20 mg total) by mouth daily.  90 tablet  3  . fish oil-omega-3 fatty acids 1000 MG capsule Take 2 g by mouth daily.          No Known Allergies  History   Social History  . Marital Status: Married    Spouse Name: N/A    Number of  Children: 2  . Years of Education: N/A   Occupational History  .     Social History Main Topics  . Smoking status: Never Smoker   . Smokeless tobacco: Never Used  . Alcohol Use: 12.6 oz/week    21 Glasses of wine per week     Comment: socially   . Drug Use: No  . Sexually Active: Not on file   Other Topics Concern  . Not on file   Social History Narrative   Plays soccer at [employer]    Family History  Problem Relation Age of Onset  . Heart disease Mother   . Cancer Mother     breast  . Cancer      colon, prostate//grandmother and grandfather  . Mitral valve prolapse Mother     mitral valve surgery    Review of Systems:  As stated in the HPI and otherwise negative.   BP 122/78  Pulse 66  Resp 18  Ht 6\' 1"  (1.854 m)  Wt 189 lb 6.4 oz (85.911 kg)  BMI 24.99 kg/m2  SpO2 97%  Physical Examination: General: Well developed, well nourished, NAD HEENT: OP clear, mucus membranes  moist SKIN: warm, dry. No rashes. Neuro: No focal deficits Musculoskeletal: Muscle strength 5/5 all ext Psychiatric: Mood and affect normal Neck: No JVD, no carotid bruits, no thyromegaly, no lymphadenopathy. Lungs:Clear bilaterally, no wheezes, rhonci, crackles Cardiovascular: Regular rate and rhythm. No murmurs, gallops or rubs. Abdomen:Soft. Bowel sounds present. Non-tender.  Extremities: No lower extremity edema. Pulses are 2 + in the bilateral DP/PT.  Assessment and Plan: ,  1. RBBB: Normal LV and RV by echo 2010. Since he had some LV dilatation on echo 2009, will repeat echo now. If ok, repeat in 5 years.    2. HYPERTENSION: BP well controlled. No change in therapy.

## 2012-06-23 ENCOUNTER — Ambulatory Visit (HOSPITAL_COMMUNITY): Payer: BC Managed Care – PPO | Attending: Cardiovascular Disease

## 2012-06-23 DIAGNOSIS — I079 Rheumatic tricuspid valve disease, unspecified: Secondary | ICD-10-CM | POA: Insufficient documentation

## 2012-06-23 DIAGNOSIS — E785 Hyperlipidemia, unspecified: Secondary | ICD-10-CM | POA: Insufficient documentation

## 2012-06-23 DIAGNOSIS — R9431 Abnormal electrocardiogram [ECG] [EKG]: Secondary | ICD-10-CM | POA: Insufficient documentation

## 2012-06-23 DIAGNOSIS — I452 Bifascicular block: Secondary | ICD-10-CM | POA: Insufficient documentation

## 2012-06-23 DIAGNOSIS — I10 Essential (primary) hypertension: Secondary | ICD-10-CM | POA: Insufficient documentation

## 2012-06-23 NOTE — Progress Notes (Signed)
Echocardiogram performed.  

## 2012-07-11 ENCOUNTER — Other Ambulatory Visit: Payer: Self-pay | Admitting: Internal Medicine

## 2012-07-13 ENCOUNTER — Encounter: Payer: Self-pay | Admitting: *Deleted

## 2012-07-13 NOTE — Telephone Encounter (Signed)
Refill done.  Letter mailed to pt to notify him he's due for a yearly CPE.

## 2012-08-24 ENCOUNTER — Ambulatory Visit (INDEPENDENT_AMBULATORY_CARE_PROVIDER_SITE_OTHER): Payer: BC Managed Care – PPO | Admitting: Internal Medicine

## 2012-08-24 ENCOUNTER — Encounter: Payer: Self-pay | Admitting: Internal Medicine

## 2012-08-24 VITALS — BP 120/80 | HR 72 | Temp 98.5°F | Wt 190.0 lb

## 2012-08-24 DIAGNOSIS — I10 Essential (primary) hypertension: Secondary | ICD-10-CM

## 2012-08-24 DIAGNOSIS — J322 Chronic ethmoidal sinusitis: Secondary | ICD-10-CM

## 2012-08-24 MED ORDER — AZELASTINE HCL 0.1 % NA SOLN: 2.0000 | Freq: Every day | NASAL | Status: DC

## 2012-08-24 MED ORDER — AMOXICILLIN 500 MG PO CAPS: 1000.0000 mg | ORAL_CAPSULE | Freq: Two times a day (BID) | ORAL | Status: DC

## 2012-08-24 MED ORDER — HYDROCODONE-HOMATROPINE 5-1.5 MG/5ML PO SYRP: 5.0000 mL | ORAL_SOLUTION | Freq: Every evening | ORAL | Status: DC | PRN

## 2012-08-24 NOTE — Assessment & Plan Note (Addendum)
Good compliance of medication, no amb  BPs but blood pressure here is very good. Has an appointment to see me in March for a physical

## 2012-08-24 NOTE — Patient Instructions (Addendum)
Rest, fluids , tylenol For cough, take Mucinex DM twice a day as needed  Severe cough at night, take hydrocodone, will cause drowsiness For congestion use astelin nasal spray once a day until you feel better Take the antibiotic as prescribed  (Amoxicillin) Call if no better in few days Call anytime if the symptoms are severe ---- Please schedule a physical at your earliest convenience

## 2012-08-24 NOTE — Progress Notes (Signed)
  Subjective:    Patient ID: Troy Leonard, male    DOB: August 31, 1970, 42 y.o.   MRN: [id number]  HPI Acute visit Symptoms started ~ 2 days ago--Sore throat, sneezing, cough, sinus congestion. At this time he reports quite a bit of sinus pain and congestion, has been unable to sleep  Last night d/t intense cough. He also has headaches.   Past Medical History  Diagnosis Date  . Hyperlipidemia   . Anxiety     sees [name]  . Hypertension   . Multiple pigmented nevi     saw derm before   . Abnormal EKG     LVH, iRBB, sees cards , ECHO 12-10 (-)      Past Surgical History  Procedure Laterality Date  . Finger surgery    . Vasectomy  2004     Review of Systems Had a temperature of 100.8 yesterday. No chills. No  nausea, vomiting, diarrhea. No myalgias. Mild chest congestion, definitely less congested than his sinuses.    Objective:   Physical Exam  General -- alert, well-developed, NAD    HEENT -- TMs bulge and slt red , no d/c, throat w/o redness, face symmetric and   tender to palpation at maxilary sinuses. Nose congested Lungs -- normal respiratory effort, no intercostal retractions, no accessory muscle use, and few rhonchi with cough. No wheezing  Heart-- normal rate, regular rhythm, no murmur, and no gallop.   Neurologic-- alert & oriented X3 and strength normal in all extremities. Psych-- Cognition and judgment appear intact. Alert and cooperative with normal attention span and concentration.  not anxious appearing and not depressed appearing.       Assessment & Plan:   Sinusitis, Sinusitis, viral versus bacterial, unable to sleep at night. See instructions

## 2012-10-08 ENCOUNTER — Encounter: Payer: Self-pay | Admitting: Internal Medicine

## 2012-10-08 ENCOUNTER — Ambulatory Visit (INDEPENDENT_AMBULATORY_CARE_PROVIDER_SITE_OTHER): Payer: BC Managed Care – PPO | Admitting: Internal Medicine

## 2012-10-08 VITALS — BP 122/82 | HR 57 | Ht 74.0 in | Wt 194.0 lb

## 2012-10-08 DIAGNOSIS — E785 Hyperlipidemia, unspecified: Secondary | ICD-10-CM

## 2012-10-08 DIAGNOSIS — R9389 Abnormal findings on diagnostic imaging of other specified body structures: Secondary | ICD-10-CM

## 2012-10-08 DIAGNOSIS — Z0000 Encounter for general adult medical examination without abnormal findings: Secondary | ICD-10-CM

## 2012-10-08 DIAGNOSIS — I10 Essential (primary) hypertension: Secondary | ICD-10-CM

## 2012-10-08 HISTORY — DX: Hyperlipidemia, unspecified: E78.5

## 2012-10-08 LAB — COMPREHENSIVE METABOLIC PANEL
Albumin: 4.3 g/dL (ref 3.5–5.2)
BUN: 14 mg/dL (ref 6–23)
CO2: 29 mEq/L (ref 19–32)
Calcium: 9.3 mg/dL (ref 8.4–10.5)
Chloride: 100 mEq/L (ref 96–112)
Creatinine, Ser: 0.9 mg/dL (ref 0.4–1.5)
GFR: 102.6 mL/min (ref >60.00)
Glucose, Bld: 99 mg/dL (ref 70–99)
Potassium: 3.8 mEq/L (ref 3.5–5.1)

## 2012-10-08 LAB — CBC WITH DIFFERENTIAL/PLATELET
Basophils Relative: 0.5 % (ref 0.0–3.0)
Eosinophils Absolute: 0.1 10*3/uL (ref 0.0–0.7)
Eosinophils Relative: 1.3 % (ref 0.0–5.0)
Lymphocytes Relative: 28.6 % (ref 12.0–46.0)
MCHC: 34.8 g/dL (ref 30.0–36.0)
Monocytes Relative: 8.6 % (ref 3.0–12.0)
Neutrophils Relative %: 61 % (ref 43.0–77.0)
RBC: 4.29 Mil/uL (ref 4.22–5.81)
WBC: 5 10*3/uL (ref 4.5–10.5)

## 2012-10-08 LAB — TSH: TSH: 1.22 u[IU]/mL (ref 0.35–5.50)

## 2012-10-08 LAB — LIPID PANEL
HDL: 83.3 mg/dL (ref >39.00)
Triglycerides: 57 mg/dL (ref 0.0–149.0)
VLDL: 11.4 mg/dL (ref 0.0–40.0)

## 2012-10-08 NOTE — Assessment & Plan Note (Signed)
Well-controlled, no change, labs 

## 2012-10-08 NOTE — Patient Instructions (Signed)
Check the  blood pressure 2 or 3 times a month, be sure it is between 110/60 and 140/85. If it is consistently higher or lower, let me know

## 2012-10-08 NOTE — Assessment & Plan Note (Signed)
Last echocardiogram 06/2012, next 2 years

## 2012-10-08 NOTE — Progress Notes (Signed)
  Subjective:    Patient ID: Troy Leonard, male    DOB: [birth year], 42 y.o.   MRN: [id number]  HPI Complete physical exam Since the last time he was here, he is doing well. His mother had a heart attack and ended having a heart transplant, fortunately she is doing well.  Past Medical History  Diagnosis Date  . Hyperlipidemia   . Anxiety     sees [name]  . Hypertension   . Multiple pigmented nevi     saw derm before   . Abnormal EKG     LVH, iRBB, sees cards    Past Surgical History  Procedure Laterality Date  . Finger surgery    . Vasectomy  2004   History   Social History  . Marital Status: Married    Spouse Name: N/A    Number of Children: 2  . Years of Education: N/A   Occupational History  . works for a [employer]    Social History Main Topics  . Smoking status: Never Smoker   . Smokeless tobacco: Never Used  . Alcohol Use: 12.6 oz/week    21 Glasses of wine per week     Comment: socially   . Drug Use: No  . Sexually Active: Not on file   Other Topics Concern  . Not on file   Social History Narrative   Not as active as before    Diet good most of the time    Family History  Problem Relation Age of Onset  . Heart disease Mother   . Breast cancer Mother     breast  . Colon cancer      grandmother and grandfather  . Mitral valve prolapse Mother     s/p heart transplant after MI 3-14  . Cancer - Prostate Neg Hx   . Diabetes Neg Hx      Review of Systems Denies chest pain or shortness or breath No nausea, vomiting, diarrhea or blood in the stools. No dysuria or gross hematuria. Good compliance with BP meds, no ambulatory BPs. Request a PSA. Room for improvement on his diet and exercise    Objective:   Physical Exam General -- alert, well-developed   Neck --no thyromegaly  Lungs -- normal respiratory effort, no intercostal retractions, no accessory muscle use, and normal breath sounds.   Heart-- normal rate, regular rhythm, no murmur,  and no gallop.   Abdomen--soft, non-tender, no distention, no masses, no HSM, no guarding, and no rigidity.   Extremities-- no pretibial edema bilaterally  Neurologic-- alert & oriented X3 and strength normal in all extremities. Psych-- Cognition and judgment appear intact. Alert and cooperative with normal attention span and concentration.  not anxious appearing and not depressed appearing.      Assessment & Plan:

## 2012-10-08 NOTE — Assessment & Plan Note (Addendum)
Td-- 2012 check labs including a PSA per pt request Cont healthy life style Labs

## 2012-10-08 NOTE — Assessment & Plan Note (Addendum)
I recommend that LDL goal of 130. Based on the last FLP, I recommended Lipitor, he was reluctant to take. At this point we'll recheck labs, since his mother had a recent cardiac event, he is more  willing to try medications. Plan: Labs, and diet, exercise, consider medication.

## 2012-10-16 MED ORDER — ATORVASTATIN CALCIUM 20 MG PO TABS: 20.0000 mg | ORAL_TABLET | Freq: Every day | ORAL | Status: DC

## 2012-10-16 NOTE — Addendum Note (Signed)
Addended by: Edwena Felty T on: 10/16/2012 11:53 AM   Modules accepted: Orders

## 2012-10-18 ENCOUNTER — Other Ambulatory Visit: Payer: Self-pay | Admitting: Internal Medicine

## 2012-10-19 NOTE — Telephone Encounter (Signed)
Refill done.  

## 2012-12-15 ENCOUNTER — Telehealth: Payer: Self-pay | Admitting: Internal Medicine

## 2012-12-15 NOTE — Telephone Encounter (Signed)
Started cholesterol medication few months ago, due for an FLP, AST, ALT. dx hyperlipidemia. If he is indeed taking his medication, needs labs. Please arrange.

## 2012-12-16 ENCOUNTER — Encounter: Payer: Self-pay | Admitting: *Deleted

## 2012-12-16 NOTE — Telephone Encounter (Signed)
Letter mailed to pt.  

## 2012-12-21 ENCOUNTER — Other Ambulatory Visit (INDEPENDENT_AMBULATORY_CARE_PROVIDER_SITE_OTHER): Payer: BC Managed Care – PPO

## 2012-12-21 DIAGNOSIS — E785 Hyperlipidemia, unspecified: Secondary | ICD-10-CM

## 2012-12-22 LAB — AST: AST: 28 U/L (ref 0–37)

## 2012-12-22 LAB — LIPID PANEL
HDL: 83 mg/dL (ref >39.00)
Total CHOL/HDL Ratio: 2

## 2012-12-23 ENCOUNTER — Telehealth: Payer: Self-pay | Admitting: *Deleted

## 2012-12-23 MED ORDER — ATORVASTATIN CALCIUM 20 MG PO TABS: 20.0000 mg | ORAL_TABLET | Freq: Every day | ORAL | Status: DC

## 2012-12-23 NOTE — Telephone Encounter (Signed)
Message copied by Nada Maclachlan on Wed Dec 23, 2012  4:05 PM ------      Message from: Troy Leonard      Created: Tue Dec 22, 2012  5:46 PM       Call patient:      Cholesterol has responded extremely well to the cholesterol medication, it is down to 173. His liver tests remain normal.      Plan:       Refill Lipitor for one year, next visit for a physical by 09-2013.      Call if side effects or problems. ------

## 2012-12-23 NOTE — Telephone Encounter (Signed)
Left detailed msg on pt's vmail. Refill done.  

## 2013-04-12 ENCOUNTER — Encounter: Payer: Self-pay | Admitting: Internal Medicine

## 2013-04-12 ENCOUNTER — Ambulatory Visit (INDEPENDENT_AMBULATORY_CARE_PROVIDER_SITE_OTHER): Payer: BC Managed Care – PPO | Admitting: Internal Medicine

## 2013-04-12 VITALS — BP 141/92 | HR 82 | Temp 98.1°F | Wt 193.8 lb

## 2013-04-12 DIAGNOSIS — R21 Rash and other nonspecific skin eruption: Secondary | ICD-10-CM

## 2013-04-12 MED ORDER — PREDNISONE 10 MG PO TABS: ORAL_TABLET | ORAL | Status: DC

## 2013-04-12 NOTE — Progress Notes (Signed)
  Subjective:    Patient ID: Troy Leonard, male    DOB: October 21, 1970, 42 y.o.   MRN: [id number]  HPI Acute visit Here because of a rash  he developed yesterday. 2 days ago did go outside, used two types of sunscreen all over, denies any contact with poison ivy, did not use a hat. The rash is a slightly itchy  Past Medical History  Diagnosis Date  . Hyperlipidemia   . Anxiety     sees [name]  . Hypertension   . Multiple pigmented nevi     saw derm before   . Abnormal EKG     LVH, iRBB, sees cards    Past Surgical History  Procedure Laterality Date  . Finger surgery    . Vasectomy  2004     Review of Systems No fever or chills No joint aches. Not lip or tongue swelling, no shortness of breath    Objective:   Physical Exam  Constitutional: He is oriented to person, place, and time. He appears well-developed and well-nourished. No distress.  HENT:  Lips normal  Neurological: He is alert and oriented to person, place, and time.  Skin: He is not diaphoretic.             Assessment & Plan:  Rash, Rash without systemic symptoms, mostly on the sun exposed areas, photo- allergic reaction? Recommend prednisone, Claritin and patient to call if not better or if symptoms increase

## 2013-04-12 NOTE — Patient Instructions (Signed)
Take prednisone as prescribed OTC Claritin 10 mg once daily for a few days OTC hydrocortisone 1% cream to the arms Call if not better in few days Call anytime if you get worse, Have fever, chills, lip or  tongue swelling

## 2013-05-24 ENCOUNTER — Telehealth: Payer: Self-pay | Admitting: Internal Medicine

## 2013-05-24 NOTE — Telephone Encounter (Signed)
Patient called and stated that he previously found out that he has colon cancer on both sides of his family. Patient wanted to know does dr Drue Novel recommend him doing a  preventative colonoscopy screening.

## 2013-05-25 NOTE — Telephone Encounter (Signed)
Please advise patient: A very important piece of information is at what age the family members developed colon cancer and how many are involved. I recommend him to gather the information ,will discuss that when he comes back to his physical in March or he can simply send [place] a letter w/ the info

## 2013-05-27 ENCOUNTER — Telehealth: Payer: Self-pay | Admitting: Internal Medicine

## 2013-05-27 NOTE — Telephone Encounter (Signed)
Pt notified. DJR  

## 2013-05-27 NOTE — Telephone Encounter (Signed)
Patient called back with information regarding his history. Patient  great grand father died at 45 or 18 of colon cancer and grandmother died at 63 of colon cancer.

## 2013-05-31 NOTE — Telephone Encounter (Signed)
Reviewing the literature, only first degree relatives with cancer increase your personal history of colon cancer. As such, he is an average risk for colon cancer. We can discuss further when he comes back to the office

## 2013-07-13 ENCOUNTER — Ambulatory Visit (INDEPENDENT_AMBULATORY_CARE_PROVIDER_SITE_OTHER): Payer: BC Managed Care – PPO

## 2013-07-13 DIAGNOSIS — Z23 Encounter for immunization: Secondary | ICD-10-CM

## 2013-09-02 ENCOUNTER — Encounter: Payer: Self-pay | Admitting: Internal Medicine

## 2013-09-02 ENCOUNTER — Ambulatory Visit (INDEPENDENT_AMBULATORY_CARE_PROVIDER_SITE_OTHER): Payer: BC Managed Care – PPO | Admitting: Internal Medicine

## 2013-09-02 VITALS — BP 125/78 | HR 71 | Temp 98.4°F | Wt 200.0 lb

## 2013-09-02 DIAGNOSIS — J069 Acute upper respiratory infection, unspecified: Secondary | ICD-10-CM

## 2013-09-02 DIAGNOSIS — Z0000 Encounter for general adult medical examination without abnormal findings: Secondary | ICD-10-CM

## 2013-09-02 DIAGNOSIS — E785 Hyperlipidemia, unspecified: Secondary | ICD-10-CM

## 2013-09-02 MED ORDER — AZITHROMYCIN 250 MG PO TABS: ORAL_TABLET | ORAL | Status: DC

## 2013-09-02 NOTE — Patient Instructions (Signed)
Rest, fluids , tylenol  Take DayQuil/NyQuil as needed or  Mucinex DM twice a day as needed   For congestion use OTC Nasocort: 2 nasal sprays on each side of the nose daily until you feel better   Take the antibiotic as prescribed  (zithromax) if no better in 3-4 days   Call if no better in few days Call anytime if the symptoms are severe

## 2013-09-02 NOTE — Assessment & Plan Note (Signed)
Self discontinue aspirin - fish oil d/t easy bruising

## 2013-09-02 NOTE — Assessment & Plan Note (Signed)
Complaining of more noticeable aches and pains after running, related to Lipitor? That is not clear, recommend a trial w/o Lipitor for 2 or 3 weeks to see how he feels

## 2013-09-02 NOTE — Progress Notes (Signed)
   Subjective:    Patient ID: Troy Leonard, male    DOB: 21-Jun-1971, 43 y.o.   MRN: [id number]  DOS:  09/02/2013 Acute visit,  Symptoms started 4 days ago:  cough which is worst at night, sinus pressure, sinus > chest congestion . Taking DayQuil and NyQuil with some help. No generalized myalgias.   ROS Patient is afebrile here today. He is coughing occasional light green sputum mostly in the mornings. + Nasal discharge occasionalw/ blood . Denies nausea, vomiting or diarrhea   Past Medical History  Diagnosis Date  . Hyperlipidemia   . Anxiety     sees Dr Caprice Beaver  . Hypertension   . Multiple pigmented nevi     saw derm before   . Abnormal EKG     LVH, iRBB, sees cards     Past Surgical History  Procedure Laterality Date  . Finger surgery    . Vasectomy  2004    History   Social History  . Marital Status: Married    Spouse Name: N/A    Number of Children: 2  . Years of Education: N/A   Occupational History  . works for a Doctor, general practice    Social History Main Topics  . Smoking status: Never Smoker   . Smokeless tobacco: Never Used  . Alcohol Use: 12.6 oz/week    21 Glasses of wine per week     Comment: socially   . Drug Use: No  . Sexual Activity: Not on file   Other Topics Concern  . Not on file   Social History Narrative   Not as active as before    Diet good most of the time         Medication List       This list is accurate as of: 09/02/13  4:23 PM.  Always use your most recent med list.               atorvastatin 20 MG tablet  Commonly known as:  LIPITOR  Take 1 tablet (20 mg total) by mouth daily.     azithromycin 250 MG tablet  Commonly known as:  ZITHROMAX Z-PAK  As directed     benazepril 20 MG tablet  Commonly known as:  LOTENSIN  TAKE 1 TABLET EVERY DAY     clonazePAM 2 MG tablet  Commonly known as:  KLONOPIN  Take 2 mg by mouth daily as needed.     CO Q 10 PO  Take by mouth.     multivitamin tablet  Take 1 tablet by  mouth daily.           Objective:   Physical Exam BP 125/78  Pulse 71  Temp(Src) 98.4 F (36.9 C)  Wt 200 lb (90.719 kg)  SpO2 95% General -- alert, well-developed, NAD.   HEENT-- Not pale. TMs bulge B, no red , no d/c Throat symmetric, no redness or discharge.  Face symmetric, sinuses not tender to palpation. Nose congested. Lungs -- normal respiratory effort, no intercostal retractions, no accessory muscle use, and normal breath sounds.  Heart-- normal rate, regular rhythm, no murmur.  Extremities-- no pretibial edema bilaterally  Neurologic--  alert & oriented X3. Speech normal, gait normal, strength normal in all extremities.  Psych-- Cognition and judgment appear intact. Cooperative with normal attention span and concentration. No anxious or depressed appearing.      Assessment & Plan:   URI-- see instructions

## 2013-09-02 NOTE — Progress Notes (Signed)
Pre visit review using our clinic review tool, if applicable. No additional management support is needed unless otherwise documented below in the visit note. 

## 2013-10-12 ENCOUNTER — Encounter: Payer: BC Managed Care – PPO | Admitting: Internal Medicine

## 2013-10-14 ENCOUNTER — Telehealth: Payer: Self-pay | Admitting: *Deleted

## 2013-10-14 ENCOUNTER — Other Ambulatory Visit: Payer: Self-pay | Admitting: Internal Medicine

## 2013-10-14 MED ORDER — BENAZEPRIL HCL 20 MG PO TABS: ORAL_TABLET | ORAL | Status: DC

## 2013-10-14 NOTE — Telephone Encounter (Signed)
Medication filled.  Sent to [facility] at [facility].  Confirmation from pharmacy received electronically.  Called patient. No answer.  Detailed message left on phone making patient aware that prescription has been filled.

## 2013-10-14 NOTE — Telephone Encounter (Signed)
10/14/2013  Pt called to request refill on benazepril (LOTENSIN) 20 MG tablet.  Pt states pharmacy sent request and has not received anything back from [place].  bw

## 2013-10-21 ENCOUNTER — Encounter: Payer: BC Managed Care – PPO | Admitting: Internal Medicine

## 2013-11-05 ENCOUNTER — Ambulatory Visit (INDEPENDENT_AMBULATORY_CARE_PROVIDER_SITE_OTHER): Payer: BC Managed Care – PPO | Admitting: Internal Medicine

## 2013-11-05 ENCOUNTER — Encounter: Payer: Self-pay | Admitting: Internal Medicine

## 2013-11-05 VITALS — BP 120/64 | HR 75 | Temp 98.0°F | Ht 72.6 in | Wt 200.0 lb

## 2013-11-05 DIAGNOSIS — Z0000 Encounter for general adult medical examination without abnormal findings: Secondary | ICD-10-CM

## 2013-11-05 NOTE — Progress Notes (Signed)
Subjective:    Patient ID: Troy Leonard, male    DOB: Jun 01, 1971, 43 y.o.   MRN: [id number]  DOS:  11/05/2013 Type of  visit: CPX No concerns, good compliance w/ medication   ROS  Eating healthy is challenged due to his travel schedule. Is active on and off, in November he was getting ready for a marathon,  currently not exercising much. Denies chest pain, difficulty breathing, palpitations. No nausea, vomiting, diarrhea or blood in the stools. No cough or sputum production. No dysuria or gross hematuria.  Past Medical History  Diagnosis Date  . Hyperlipidemia   . Anxiety     sees Dr Caprice Beaver  . Hypertension   . Multiple pigmented nevi     saw derm before   . Abnormal EKG     LVH, iRBB, sees cards     Past Surgical History  Procedure Laterality Date  . Finger surgery    . Vasectomy  2004    History   Social History  . Marital Status: Married    Spouse Name: N/A    Number of Children: 2  . Years of Education: N/A   Occupational History  . works for a Doctor, general practice    Social History Main Topics  . Smoking status: Never Smoker   . Smokeless tobacco: Never Used  . Alcohol Use: 12.6 oz/week    21 Glasses of wine per week     Comment: socially   . Drug Use: No  . Sexual Activity: Not on file   Other Topics Concern  . Not on file   Social History Narrative  . No narrative on file     Family History  Problem Relation Age of Onset  . Heart disease Mother   . Breast cancer Mother     breast  . Colon cancer Other     grandmother and grandfather  . Mitral valve prolapse Mother     s/p heart transplant after MI 3-14  . Cancer - Prostate Neg Hx   . Diabetes Neg Hx        Medication List       This list is accurate as of: 11/05/13 11:59 PM.  Always use your most recent med list.               atorvastatin 20 MG tablet  Commonly known as:  LIPITOR  Take 1 tablet (20 mg total) by mouth daily.     benazepril 20 MG tablet  Commonly known as:   LOTENSIN  TAKE 1 TABLET EVERY DAY     clonazePAM 2 MG tablet  Commonly known as:  KLONOPIN  Take 2 mg by mouth daily as needed.     CO Q 10 PO  Take by mouth.     multivitamin tablet  Take 1 tablet by mouth daily.           Objective:   Physical Exam BP 120/64  Pulse 75  Temp(Src) 98 F (36.7 C)  Ht 6' 0.6" (1.844 m)  Wt 200 lb (90.719 kg)  BMI 26.68 kg/m2  SpO2 97%  General -- alert, well-developed, NAD.  Neck --no thyromegaly , normal carotid pulse  HEENT-- Not pale.  Lungs -- normal respiratory effort, no intercostal retractions, no accessory muscle use, and normal breath sounds.  Heart-- normal rate, regular rhythm, no murmur.  Abdomen-- Not distended, good bowel sounds,soft, non-tender.  Extremities-- no pretibial edema bilaterally  Neurologic--  alert & oriented X3. Speech normal, gait  normal, strength normal in all extremities.  Psych-- Cognition and judgment appear intact. Cooperative with normal attention span and concentration. No anxious or depressed appearing.       Assessment & Plan:

## 2013-11-05 NOTE — Progress Notes (Signed)
Pre visit review using our clinic review tool, if applicable. No additional management support is needed unless otherwise documented below in the visit note. 

## 2013-11-05 NOTE — Patient Instructions (Signed)
Get your blood work before you leave   Next visit is for a physical exam in 1 year, fasting Please make an appointment    Please see a dermatologist, let me know if you need a referral  Due for a echocardiogram by December 2015     Testicular Self-Exam A self-examination of your testicles involves looking at and feeling your testicles for abnormal lumps or swelling. Several things can cause swelling, lumps, or pain in your testicles. Some of these causes are:  Injuries.  Inflammation.  Infection.  Accumulation of fluids around your testicle (hydrocele).  Twisted testicles (testicular torsion).  Testicular cancer. Self-examination of the testicles and groin areas may be advised if you are at risk for testicular cancer. Risks for testicular cancer include:  An undescended testicle (cryptorchidism).  A history of previous testicular cancer.  A family history of testicular cancer. The testicles are easiest to examine after warm baths or showers and are more difficult to examine when you are cold. This is because the muscles attached to the testicles retract and pull them up higher or into the abdomen. Follow these steps while you are standing:  Hold your penis away from your body.  Roll one testicle between your thumb and forefinger, feeling the entire testicle.  Roll the other testicle between your thumb and forefinger, feeling the entire testicle. Feel for lumps, swelling, or discomfort. A normal testicle is egg shaped and feels firm. It is smooth and not tender. The spermatic cord can be felt as a firm spaghetti-like cord at the back of your testicle. It is also important to examine the crease between the front of your leg and your abdomen. Feel for any bumps that are tender. These could be enlarged lymph nodes.  Document Released: 10/07/2000 Document Revised: 03/03/2013 Document Reviewed: 12/21/2012 [facility] Patient Information 2014 [place], [place].

## 2013-11-05 NOTE — Assessment & Plan Note (Addendum)
Td-- 2012  FH---one GGP w/ h/o colon cancer (age mid 45s). Has another GP w/ same, age? No FH  colon polyps.  Discussed diet and exercise  Labs including a PSA (Had a very small snack   6 hours ago)  STE EKG iRBB at baseline Chronic issues: BP and cholesterol, good medication compliance, ambulatory BPs. No change Needs to see dermatology, recommend to call me if a referral is needed. Next echocardiogram around 06/2014

## 2013-11-06 LAB — CBC WITH DIFFERENTIAL/PLATELET
BASOS ABS: 0 10*3/uL (ref 0.0–0.1)
BASOS PCT: 0 % (ref 0–1)
EOS ABS: 0.1 10*3/uL (ref 0.0–0.7)
Eosinophils Relative: 1 % (ref 0–5)
HCT: 40.2 % (ref 39.0–52.0)
Hemoglobin: 13.8 g/dL (ref 13.0–17.0)
Lymphocytes Relative: 29 % (ref 12–46)
Lymphs Abs: 2.1 10*3/uL (ref 0.7–4.0)
MCH: 31.2 pg (ref 26.0–34.0)
MCHC: 34.3 g/dL (ref 30.0–36.0)
MCV: 90.7 fL (ref 78.0–100.0)
MONOS PCT: 7 % (ref 3–12)
Monocytes Absolute: 0.5 10*3/uL (ref 0.1–1.0)
NEUTROS ABS: 4.7 10*3/uL (ref 1.7–7.7)
NEUTROS PCT: 63 % (ref 43–77)
PLATELETS: 199 10*3/uL (ref 150–400)
RBC: 4.43 MIL/uL (ref 4.22–5.81)
RDW: 13.1 % (ref 11.5–15.5)
WBC: 7.4 10*3/uL (ref 4.0–10.5)

## 2013-11-06 LAB — COMPREHENSIVE METABOLIC PANEL
ALT: 30 U/L (ref 0–53)
AST: 27 U/L (ref 0–37)
Albumin: 4.5 g/dL (ref 3.5–5.2)
Alkaline Phosphatase: 59 U/L (ref 39–117)
BILIRUBIN TOTAL: 0.6 mg/dL (ref 0.2–1.2)
BUN: 16 mg/dL (ref 6–23)
CO2: 29 mEq/L (ref 19–32)
Calcium: 9.3 mg/dL (ref 8.4–10.5)
Chloride: 100 mEq/L (ref 96–112)
Creat: 0.88 mg/dL (ref 0.50–1.35)
GLUCOSE: 83 mg/dL (ref 70–99)
Potassium: 4.1 mEq/L (ref 3.5–5.3)
Sodium: 140 mEq/L (ref 135–145)
TOTAL PROTEIN: 6.5 g/dL (ref 6.0–8.3)

## 2013-11-06 LAB — LIPID PANEL
Cholesterol: 194 mg/dL (ref 0–200)
HDL: 83 mg/dL (ref >39)
LDL Cholesterol: 96 mg/dL (ref 0–99)
TRIGLYCERIDES: 75 mg/dL (ref <150)
Total CHOL/HDL Ratio: 2.3 Ratio
VLDL: 15 mg/dL (ref 0–40)

## 2013-11-06 LAB — PSA: PSA: 0.56 ng/mL (ref <=4.00)

## 2013-11-09 ENCOUNTER — Encounter: Payer: Self-pay | Admitting: *Deleted

## 2013-12-08 ENCOUNTER — Other Ambulatory Visit: Payer: Self-pay | Admitting: Internal Medicine

## 2013-12-09 ENCOUNTER — Other Ambulatory Visit: Payer: Self-pay | Admitting: Internal Medicine

## 2013-12-11 ENCOUNTER — Other Ambulatory Visit: Payer: Self-pay | Admitting: Internal Medicine

## 2014-01-21 ENCOUNTER — Telehealth: Payer: Self-pay | Admitting: Internal Medicine

## 2014-01-21 NOTE — Telephone Encounter (Signed)
Pt notified. Done.

## 2014-01-21 NOTE — Telephone Encounter (Signed)
Caller name: Caliph  Call back number:[phone]   Reason for call:  Pt wants to make sure that there were no abnormalities on most recent lab work.  Pt just needs help understanding the values.

## 2014-03-12 ENCOUNTER — Other Ambulatory Visit: Payer: Self-pay | Admitting: Internal Medicine

## 2014-05-23 ENCOUNTER — Ambulatory Visit (HOSPITAL_BASED_OUTPATIENT_CLINIC_OR_DEPARTMENT_OTHER)
Admission: RE | Admit: 2014-05-23 | Discharge: 2014-05-23 | Disposition: A | Discharge status: Home / Self Care | Payer: BC Managed Care – PPO | Source: Ambulatory Visit | Attending: Internal Medicine | Admitting: Internal Medicine

## 2014-05-23 ENCOUNTER — Other Ambulatory Visit: Payer: Self-pay | Admitting: Internal Medicine

## 2014-05-23 ENCOUNTER — Encounter: Payer: Self-pay | Admitting: Internal Medicine

## 2014-05-23 ENCOUNTER — Ambulatory Visit (INDEPENDENT_AMBULATORY_CARE_PROVIDER_SITE_OTHER): Payer: BC Managed Care – PPO | Admitting: Internal Medicine

## 2014-05-23 VITALS — BP 135/83 | HR 61 | Temp 98.5°F | Wt 193.0 lb

## 2014-05-23 DIAGNOSIS — N50811 Right testicular pain: Secondary | ICD-10-CM

## 2014-05-23 DIAGNOSIS — N508 Other specified disorders of male genital organs: Secondary | ICD-10-CM | POA: Insufficient documentation

## 2014-05-23 DIAGNOSIS — Z23 Encounter for immunization: Secondary | ICD-10-CM

## 2014-05-23 MED ORDER — CIPROFLOXACIN HCL 500 MG PO TABS: 500.0000 mg | ORAL_TABLET | Freq: Two times a day (BID) | ORAL | Status: DC

## 2014-05-23 NOTE — Progress Notes (Signed)
Subjective:    Patient ID: Troy Leonard, male    DOB: 12-12-1970, 43 y.o.   MRN: [id number]  DOS:  05/23/2014 Type of visit - description : acute Interval history: 4 days ago, suddenly developed right testicular pain after he crossed his legs. He felt the area was painful and swollen. Symptoms are gradually decreasing; walking or doing physical activity increases the pain slightly. Not taking any medication for it.    ROS Denies fever chills No abdominal pain, nausea, vomiting, diarrhea No dysuria, gross hematuria, difficulty urinating or urinary frequency. Denies any high risk sexual behavior, no penile discharge or a rash  Past Medical History  Diagnosis Date  . Hyperlipidemia   . Anxiety     sees Dr Caprice Beaver  . Hypertension   . Multiple pigmented nevi     saw derm before   . Abnormal EKG     LVH, iRBB, sees cards     Past Surgical History  Procedure Laterality Date  . Finger surgery    . Vasectomy  2004    History   Social History  . Marital Status: Married    Spouse Name: N/A    Number of Children: 2  . Years of Education: N/A   Occupational History  . works for a Doctor, general practice    Social History Main Topics  . Smoking status: Never Smoker   . Smokeless tobacco: Never Used  . Alcohol Use: 12.6 oz/week    21 Glasses of wine per week     Comment: socially   . Drug Use: No  . Sexual Activity: Not on file   Other Topics Concern  . Not on file   Social History Narrative   Household-- pt wife, 2 children        Medication List       This list is accurate as of: 05/23/14 11:59 PM.  Always use your most recent med list.               atorvastatin 20 MG tablet  Commonly known as:  LIPITOR  TAKE 1 TABLET (20 MG TOTAL) BY MOUTH DAILY.     benazepril 10 MG tablet  Commonly known as:  LOTENSIN  TAKE 2 TABLETS BY MOUTH DAILY     ciprofloxacin 500 MG tablet  Commonly known as:  CIPRO  Take 1 tablet (500 mg total) by mouth 2 (two) times daily.     clonazePAM 2 MG tablet  Commonly known as:  KLONOPIN  Take 2 mg by mouth daily as needed.     CO Q 10 PO  Take by mouth.     multivitamin tablet  Take 1 tablet by mouth daily.           Objective:   Physical Exam BP 135/83 mmHg  Pulse 61  Temp(Src) 98.5 F (36.9 C) (Oral)  Wt 193 lb (87.544 kg)  SpO2 99%  General -- alert, well-developed, NAD.   Abdomen-- Not distended, good bowel sounds,soft, non-tender. No rebound or rigidity. GU- Penis without lesions or discharge Left testicle normal Right testicle without mass. Right epididymal is more prominent compared to the left and a slightly tender distally. Psych-- Cognition and judgment appear intact. Cooperative with normal attention span and concentration. No anxious or depressed appearing.       Assessment & Plan:    Testicular pain, 43 year old gentleman with history of previous vasectomy ~ 10 years ago, no risk factors for  STDs presents with right testicular pain. Epididymitis?  Plan: Empiric Cipro, UA, ultrasound. see instructions

## 2014-05-23 NOTE — Patient Instructions (Signed)
Go to the lab   Take the antibiotics as prescribed  Call anytime if you have fever, chills or severe symptoms  Call if you are not gradually feeling better in the next few days  We will get a ultrasound

## 2014-05-23 NOTE — Progress Notes (Signed)
Pre visit review using our clinic review tool, if applicable. No additional management support is needed unless otherwise documented below in the visit note. 

## 2014-05-24 LAB — URINALYSIS, ROUTINE W REFLEX MICROSCOPIC
Bilirubin Urine: NEGATIVE
HGB URINE DIPSTICK: NEGATIVE
Ketones, ur: NEGATIVE
LEUKOCYTES UA: NEGATIVE
Nitrite: NEGATIVE
RBC / HPF: NONE SEEN (ref 0–?)
Specific Gravity, Urine: <=1.005 — AB (ref 1.000–1.030)
Total Protein, Urine: NEGATIVE
URINE GLUCOSE: NEGATIVE
UROBILINOGEN UA: 0.2 (ref 0.0–1.0)
WBC, UA: NONE SEEN (ref 0–?)
pH: 6 (ref 5.0–8.0)

## 2014-06-11 ENCOUNTER — Other Ambulatory Visit: Payer: Self-pay | Admitting: Internal Medicine

## 2014-06-14 ENCOUNTER — Other Ambulatory Visit: Payer: Self-pay

## 2014-06-14 MED ORDER — ATORVASTATIN CALCIUM 20 MG PO TABS: ORAL_TABLET | ORAL | Status: DC

## 2014-07-21 ENCOUNTER — Encounter: Payer: Self-pay | Admitting: Cardiovascular Disease

## 2014-07-21 ENCOUNTER — Ambulatory Visit (INDEPENDENT_AMBULATORY_CARE_PROVIDER_SITE_OTHER): Payer: BLUE CROSS/BLUE SHIELD | Admitting: Cardiovascular Disease

## 2014-07-21 VITALS — BP 108/70 | HR 69 | Ht 73.0 in | Wt 195.0 lb

## 2014-07-21 DIAGNOSIS — I10 Essential (primary) hypertension: Secondary | ICD-10-CM

## 2014-07-21 DIAGNOSIS — I351 Nonrheumatic aortic (valve) insufficiency: Secondary | ICD-10-CM

## 2014-07-21 DIAGNOSIS — I4510 Unspecified right bundle-branch block: Secondary | ICD-10-CM

## 2014-07-21 MED ORDER — BENAZEPRIL HCL 10 MG PO TABS: 10.0000 mg | ORAL_TABLET | Freq: Every day | ORAL | Status: DC

## 2014-07-21 NOTE — Patient Instructions (Addendum)
Your physician wants you to follow-up in:  2 years.You will receive a reminder letter in the mail two months in advance. If you don't receive a letter, please call our office to schedule the follow-up appointment.  Your physician has requested that you have an echocardiogram. Echocardiography is a painless test that uses sound waves to create images of your heart. It provides your doctor with information about the size and shape of your heart and how well your heart's chambers and valves are working. This procedure takes approximately one hour. There are no restrictions for this procedure.  To be done in June or July 2016   Your physician has recommended you make the following change in your medication:  Decrease Benazapril to 10 mg by mouth daily

## 2014-07-21 NOTE — Progress Notes (Signed)
History of Present Illness: 44 yo white male with a history of anxiety, hyperlipidemia and HTN who is here today for cardiac follow up. He was initially seen in our office on June 13, 2008 for further evaluation of an abnormal EKG. The patient at that time denied having any chest pain, but did complain of occasional shortness of breath when he was having episodes of anxiety. His EKG in our office showed a normal conduction pattern, but did show left ventricular hypertrophy. Echo 06/23/12 showed LVEF=55-65%, mild LV dilatation, trivial AI.   He returns today for follow up. He denies SOB or chest pain. He has been exercising daily. He runs several days per week. He does not occasional dizziness. HIs BP has been as low as 99M systolic at home.   Primary Care Physician: Kathlene November  Last Lipid Profile:Lipid Panel     Component Value Date/Time   CHOL 194 11/05/2013 1650   TRIG 75 11/05/2013 1650   HDL 83 11/05/2013 1650   CHOLHDL 2.3 11/05/2013 1650   VLDL 15 11/05/2013 1650   LDLCALC 96 11/05/2013 1650    Past Medical History  Diagnosis Date  . Hyperlipidemia   . Anxiety     sees Dr Caprice Beaver  . Hypertension   . Multiple pigmented nevi     saw derm before   . Abnormal EKG     LVH, iRBB, sees cards     Past Surgical History  Procedure Laterality Date  . Finger surgery    . Vasectomy  2004    Current Outpatient Prescriptions  Medication Sig Dispense Refill  . atorvastatin (LIPITOR) 20 MG tablet TAKE 1 TABLET (20 MG TOTAL) BY MOUTH DAILY. 90 tablet 1  . benazepril (LOTENSIN) 10 MG tablet TAKE 2 TABLETS BY MOUTH DAILY 60 tablet 5  . clonazePAM (KLONOPIN) 2 MG tablet Take 2 mg by mouth daily. 2mg  by mouth daily    . Coenzyme Q10 (CO Q 10 PO) Take by mouth daily.     . Multiple Vitamin (MULTIVITAMIN) tablet Take 1 tablet by mouth daily.       No current facility-administered medications for this visit.    No Known Allergies  History   Social History  . Marital Status:  Married    Spouse Name: N/A    Number of Children: 2  . Years of Education: N/A   Occupational History  . works for a Doctor, general practice    Social History Main Topics  . Smoking status: Never Smoker   . Smokeless tobacco: Never Used  . Alcohol Use: 12.6 oz/week    21 Glasses of wine per week     Comment: socially   . Drug Use: No  . Sexual Activity: Not on file   Other Topics Concern  . Not on file   Social History Narrative   Household-- pt wife, 2 children    Family History  Problem Relation Age of Onset  . Heart disease Mother   . Breast cancer Mother     breast  . Colon cancer Other     grandmother and grandfather  . Mitral valve prolapse Mother     s/p heart transplant after MI 3-14  . Cancer - Prostate Neg Hx   . Diabetes Neg Hx     Review of Systems:  As stated in the HPI and otherwise negative.   BP 108/70 mmHg  Pulse 69  Ht 6\' 1"  (1.854 m)  Wt 195 lb (88.451 kg)  BMI  25.73 kg/m2  SpO2 98%  Physical Examination: General: Well developed, well nourished, NAD HEENT: OP clear, mucus membranes moist SKIN: warm, dry. No rashes. Neuro: No focal deficits Musculoskeletal: Muscle strength 5/5 all ext Psychiatric: Mood and affect normal Neck: No JVD, no carotid bruits, no thyromegaly, no lymphadenopathy. Lungs:Clear bilaterally, no wheezes, rhonci, crackles Cardiovascular: Regular rate and rhythm. No murmurs, gallops or rubs. Abdomen:Soft. Bowel sounds present. Non-tender.  Extremities: No lower extremity edema. Pulses are 2 + in the bilateral DP/PT.  Echo 06/23/12: Left ventricle: The cavity size was mildly dilated. Wall thickness was normal. Systolic function was normal. The estimated ejection fraction was in the range of 55% to 65%. Wall motion was normal; there were no regional wall motion abnormalities. Left ventricular diastolic function parameters were normal. - Aortic valve: Trivial regurgitation.  Assessment and Plan:   1. RBBB: Normal LV  and RV by echo 2013.  2. HYPERTENSION: BP well controlled. Will decreased Lisinopril to 10 mg daily given several episodes of dizziness.   3. Aortic insufficiency: Trivial to mild by echo 2013. Repeat echo June 2016.

## 2014-08-31 ENCOUNTER — Other Ambulatory Visit: Payer: Self-pay | Admitting: Internal Medicine

## 2014-09-09 ENCOUNTER — Other Ambulatory Visit: Payer: Self-pay | Admitting: Internal Medicine

## 2014-11-07 ENCOUNTER — Ambulatory Visit (INDEPENDENT_AMBULATORY_CARE_PROVIDER_SITE_OTHER): Payer: BLUE CROSS/BLUE SHIELD | Admitting: Internal Medicine

## 2014-11-07 ENCOUNTER — Other Ambulatory Visit: Payer: Self-pay

## 2014-11-07 ENCOUNTER — Encounter: Payer: Self-pay | Admitting: Internal Medicine

## 2014-11-07 VITALS — BP 126/66 | HR 52 | Temp 97.7°F | Wt 195.2 lb

## 2014-11-07 DIAGNOSIS — D239 Other benign neoplasm of skin, unspecified: Secondary | ICD-10-CM | POA: Diagnosis not present

## 2014-11-07 DIAGNOSIS — M545 Low back pain, unspecified: Secondary | ICD-10-CM

## 2014-11-07 NOTE — Progress Notes (Signed)
   Subjective:    Patient ID: Troy Leonard, male    DOB: 07/24/1970, 44 y.o.   MRN: [id number]  DOS:  11/07/2014 Type of visit - description : acute Interval history: Has a few elevated skin spots at the scalp, likes to see a new dermatologist  Also, on-off mid line, low back pain; one day was intense after he took airplane trip. That particular day the pain radiated to both legs anteriorly. Also the pain is increased when he is jogging in a incline or decline  surface.   Review of Systems Denies fever chills. No history of back injury. No bladder or bowel incontinence No rash or lower extremity edema  Past Medical History  Diagnosis Date  . Hyperlipidemia   . Anxiety     sees Dr Caprice Beaver  . Hypertension   . Multiple pigmented nevi     saw derm before   . Abnormal EKG     LVH, iRBB, sees cards     Past Surgical History  Procedure Laterality Date  . Finger surgery    . Vasectomy  2004    History   Social History  . Marital Status: Married    Spouse Name: N/A  . Number of Children: 2  . Years of Education: N/A   Occupational History  . works for a Doctor, general practice    Social History Main Topics  . Smoking status: Never Smoker   . Smokeless tobacco: Never Used  . Alcohol Use: 12.6 oz/week    21 Glasses of wine per week     Comment: socially   . Drug Use: No  . Sexual Activity: Not on file   Other Topics Concern  . Not on file   Social History Narrative   Household-- pt wife, 2 children        Medication List       This list is accurate as of: 11/07/14  2:38 PM.  Always use your most recent med list.               atorvastatin 20 MG tablet  Commonly known as:  LIPITOR  Take 1 tablet daily. DUE FOR ROUTINE OFFICE VISIT WITH DR Curtisha Bendix. [phone]     benazepril 10 MG tablet  Commonly known as:  LOTENSIN  Take 1 tablet (10 mg total) by mouth daily.     clonazePAM 2 MG tablet  Commonly known as:  KLONOPIN  Take 2 mg by mouth daily. 2mg  by mouth daily     CO Q 10 PO  Take by mouth daily.     multivitamin tablet  Take 1 tablet by mouth daily.           Objective:   Physical Exam BP 126/66 mmHg  Pulse 52  Temp(Src) 97.7 F (36.5 C) (Oral)  Wt 195 lb 4 oz (88.565 kg)  SpO2 98%  General:   Well developed, well nourished . NAD.  HEENT:  Normocephalic . Face symmetric, atraumatic    Muscle skeletal: no pretibial edema bilaterally  Skin: Not pale. Not jaundice Neurologic:  alert & oriented X3.  Speech normal, gait appropriate for age and unassisted Lower back slightly TTP at the lumbar spine. DTRs symmetric Straight leg test negative Motor symmetric Psych--  Cognition and judgment appear intact.  Cooperative with normal attention span and concentration.  Behavior appropriate. No anxious or depressed appearing.       Assessment & Plan:

## 2014-11-07 NOTE — Progress Notes (Signed)
Pre visit review using our clinic review tool, if applicable. No additional management support is needed unless otherwise documented below in the visit note. 

## 2014-11-07 NOTE — Assessment & Plan Note (Signed)
C/o skin lesions, during the exam I was unable to identify any lesions in the scalp however the patient still would like to see dermatology for a checkup, has fair skin. Used to see Dr. Nevada Crane, likes to see somebody new in the [facility] area

## 2014-11-07 NOTE — Patient Instructions (Signed)
[place] [facility] with information about home physical therapy for back pain: [email or web address]   Take occasional Motrin or Tylenol if you have back pain If the back pain continue please let me know  You're due for a physical, please schedule a visit at your convenience

## 2014-11-08 NOTE — Assessment & Plan Note (Signed)
  Low back pain without red flag symptoms The patient likes to see a masseuse and I think that is appropriate. Recommend stretching, see instructions Okay to use occasional Tylenol and Motrin, if not improving in the next 3 weeks now is to call us for further advice.

## 2014-11-09 ENCOUNTER — Encounter: Payer: Self-pay | Admitting: Internal Medicine

## 2014-12-07 ENCOUNTER — Other Ambulatory Visit: Payer: Self-pay | Admitting: Internal Medicine

## 2015-01-06 ENCOUNTER — Other Ambulatory Visit (HOSPITAL_COMMUNITY): Payer: BLUE CROSS/BLUE SHIELD

## 2015-01-06 ENCOUNTER — Ambulatory Visit (HOSPITAL_COMMUNITY): Payer: BLUE CROSS/BLUE SHIELD | Attending: Cardiovascular Disease

## 2015-01-06 ENCOUNTER — Other Ambulatory Visit: Payer: Self-pay

## 2015-01-06 DIAGNOSIS — I4510 Unspecified right bundle-branch block: Secondary | ICD-10-CM | POA: Insufficient documentation

## 2015-01-06 DIAGNOSIS — I351 Nonrheumatic aortic (valve) insufficiency: Secondary | ICD-10-CM | POA: Diagnosis not present

## 2015-01-06 DIAGNOSIS — E785 Hyperlipidemia, unspecified: Secondary | ICD-10-CM | POA: Diagnosis not present

## 2015-01-06 DIAGNOSIS — I10 Essential (primary) hypertension: Secondary | ICD-10-CM | POA: Insufficient documentation

## 2015-08-09 ENCOUNTER — Other Ambulatory Visit: Payer: Self-pay | Admitting: Cardiovascular Disease

## 2015-09-07 ENCOUNTER — Encounter: Payer: Self-pay | Admitting: Internal Medicine

## 2015-09-07 ENCOUNTER — Ambulatory Visit (INDEPENDENT_AMBULATORY_CARE_PROVIDER_SITE_OTHER): Payer: BLUE CROSS/BLUE SHIELD | Admitting: Internal Medicine

## 2015-09-07 ENCOUNTER — Other Ambulatory Visit: Payer: Self-pay | Admitting: Internal Medicine

## 2015-09-07 VITALS — BP 112/74 | HR 43 | Temp 98.1°F | Ht 73.0 in | Wt 184.0 lb

## 2015-09-07 DIAGNOSIS — I10 Essential (primary) hypertension: Secondary | ICD-10-CM | POA: Diagnosis not present

## 2015-09-07 DIAGNOSIS — Z125 Encounter for screening for malignant neoplasm of prostate: Secondary | ICD-10-CM

## 2015-09-07 DIAGNOSIS — Z09 Encounter for follow-up examination after completed treatment for conditions other than malignant neoplasm: Secondary | ICD-10-CM

## 2015-09-07 DIAGNOSIS — Z23 Encounter for immunization: Secondary | ICD-10-CM

## 2015-09-07 DIAGNOSIS — E785 Hyperlipidemia, unspecified: Secondary | ICD-10-CM | POA: Diagnosis not present

## 2015-09-07 LAB — CBC WITH DIFFERENTIAL/PLATELET
BASOS ABS: 0 10*3/uL (ref 0.0–0.1)
Basophils Relative: 0.3 % (ref 0.0–3.0)
EOS ABS: 0.1 10*3/uL (ref 0.0–0.7)
Eosinophils Relative: 1.7 % (ref 0.0–5.0)
HCT: 37.8 % — ABNORMAL LOW (ref 39.0–52.0)
HEMOGLOBIN: 12.9 g/dL — AB (ref 13.0–17.0)
LYMPHS PCT: 28.4 % (ref 12.0–46.0)
Lymphs Abs: 1.4 10*3/uL (ref 0.7–4.0)
MCHC: 34.3 g/dL (ref 30.0–36.0)
MCV: 88.9 fl (ref 78.0–100.0)
Monocytes Absolute: 0.4 10*3/uL (ref 0.1–1.0)
Monocytes Relative: 8 % (ref 3.0–12.0)
NEUTROS ABS: 3.1 10*3/uL (ref 1.4–7.7)
Neutrophils Relative %: 61.6 % (ref 43.0–77.0)
PLATELETS: 170 10*3/uL (ref 150.0–400.0)
RBC: 4.25 Mil/uL (ref 4.22–5.81)
RDW: 12.4 % (ref 11.5–15.5)
WBC: 5 10*3/uL (ref 4.0–10.5)

## 2015-09-07 LAB — COMPREHENSIVE METABOLIC PANEL
ALT: 19 U/L (ref 0–53)
AST: 19 U/L (ref 0–37)
Albumin: 4.5 g/dL (ref 3.5–5.2)
Alkaline Phosphatase: 60 U/L (ref 39–117)
BUN: 16 mg/dL (ref 6–23)
CHLORIDE: 104 meq/L (ref 96–112)
CO2: 30 meq/L (ref 19–32)
CREATININE: 0.94 mg/dL (ref 0.40–1.50)
Calcium: 9.3 mg/dL (ref 8.4–10.5)
GFR: 92.55 mL/min (ref >60.00)
GLUCOSE: 108 mg/dL — AB (ref 70–99)
Potassium: 3.9 mEq/L (ref 3.5–5.1)
SODIUM: 140 meq/L (ref 135–145)
Total Bilirubin: 0.6 mg/dL (ref 0.2–1.2)
Total Protein: 6.3 g/dL (ref 6.0–8.3)

## 2015-09-07 LAB — LIPID PANEL
CHOL/HDL RATIO: 2
Cholesterol: 148 mg/dL (ref 0–200)
HDL: 62.5 mg/dL (ref >39.00)
LDL CALC: 77 mg/dL (ref 0–99)
NonHDL: 85.14
Triglycerides: 42 mg/dL (ref 0.0–149.0)
VLDL: 8.4 mg/dL (ref 0.0–40.0)

## 2015-09-07 LAB — PSA: PSA: 0.71 ng/mL (ref 0.10–4.00)

## 2015-09-07 MED ORDER — ATORVASTATIN CALCIUM 10 MG PO TABS: 10.0000 mg | ORAL_TABLET | Freq: Every day | ORAL | Status: DC

## 2015-09-07 NOTE — Progress Notes (Signed)
Pre visit review using our clinic review tool, if applicable. No additional management support is needed unless otherwise documented below in the visit note. 

## 2015-09-07 NOTE — Progress Notes (Signed)
Subjective:    Patient ID: Troy Leonard, male    DOB: April 10, 1971, 45 y.o.   MRN: [id number]  DOS:  09/07/2015 Type of visit - description : Routine checkup Interval history: Hyperlipidemia: On statins, no side effects, would like to see if he could stop medication. HTN: Good compliance with Lotensin, feeling great. Stop medication?. Prostate cancer screening: Request a PSA   Review of Systems  He is doing extremely well with lifestyle, more exercise, healthier diet, has lost 10 pounds Denies chest pain or difficulty breathing No nausea, vomiting, diarrhea. No dysuria gross hematuria, difficulty urinating or urinary frequency  Past Medical History  Diagnosis Date  . Hyperlipidemia   . Anxiety     sees Dr Caprice Beaver  . Hypertension   . Multiple pigmented nevi     saw derm before   . Abnormal EKG     LVH, iRBB, sees cards     Past Surgical History  Procedure Laterality Date  . Finger surgery    . Vasectomy  2004    Social History   Social History  . Marital Status: Married    Spouse Name: N/A  . Number of Children: 2  . Years of Education: N/A   Occupational History  . works for a Doctor, general practice    Social History Main Topics  . Smoking status: Never Smoker   . Smokeless tobacco: Never Used  . Alcohol Use: 12.6 oz/week    21 Glasses of wine per week     Comment: socially   . Drug Use: No  . Sexual Activity: Not on file   Other Topics Concern  . Not on file   Social History Narrative   Household-- pt wife, 2 children        Medication List       This list is accurate as of: 09/07/15  8:51 AM.  Always use your most recent med list.               atorvastatin 20 MG tablet  Commonly known as:  LIPITOR  Take 1 tablet (20 mg total) by mouth daily.     benazepril 10 MG tablet  Commonly known as:  LOTENSIN  TAKE 1 TABLET (10 MG TOTAL) BY MOUTH DAILY.     clonazePAM 2 MG tablet  Commonly known as:  KLONOPIN  Take 2 mg by mouth daily. 2mg  by mouth  daily     CO Q 10 PO  Take by mouth daily.     multivitamin tablet  Take 1 tablet by mouth daily.           Objective:   Physical Exam BP 112/74 mmHg  Pulse 43  Temp(Src) 98.1 F (36.7 C) (Oral)  Ht 6\' 1"  (1.854 m)  Wt 184 lb (83.462 kg)  BMI 24.28 kg/m2  SpO2 96% General:   Well developed, well nourished . NAD.  HEENT:  Normocephalic . Face symmetric, atraumatic Lungs:  CTA B Normal respiratory effort, no intercostal retractions, no accessory muscle use. Heart: RRR,  no murmur.  No pretibial edema bilaterally  Rectal:  External abnormalities: none. Normal sphincter tone. No rectal masses or tenderness.  Stool brown  Prostate: Prostate gland firm and smooth, no enlargement, nodularity, tenderness, mass, asymmetry or induration.  Skin: Not pale. Not jaundice Neurologic:  alert & oriented X3.  Speech normal, gait appropriate for age and unassisted Psych--  Cognition and judgment appear intact.  Cooperative with normal attention span and concentration.  Behavior appropriate. No  anxious or depressed appearing.      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 Caprice Beaver Multiple nevus, sees derm  PLAN: HTN: Under excellent control with Lotensin 10, has definitely improved lifestyle. Will check CMP, CBC, stop Lotensin, monitor BPs. Restart meds  if needed Hyperlipidemia: Has extremely healthy lifestyle, negative FH CAD, on Lipitor 20 mg. CV Risk ~ 1%. Will check FLP, decrease Lipitor to 10 mg qd and consider d/c meds . Prostate cancer screening: Request a PSA, new guidelines discussed, he does like to proceed with a screening. DRE negative, check a PSA RTC 6 months, CPX (or sooner, has an appoint 12-2015) 1 care-- flu shot today

## 2015-09-07 NOTE — Patient Instructions (Addendum)
GO TO THE LAB : Get the blood work    GO TO THE FRONT DESK  Schedule a complete physical exam to be done in 6-8 months Please be fasting    ----- Stop Lotensin, check your BPs. Consider restart Lotensin if BP is elevated  Check the  blood pressure  weekly Be sure your blood pressure is between 110/65 and  145/85. If it is consistently higher or lower, let me know  -------- Decrease  lipitor: 10 mg daily

## 2015-09-07 NOTE — Assessment & Plan Note (Signed)
HTN: Under excellent control with Lotensin 10, has definitely improved lifestyle. Will check CMP, CBC, stop Lotensin, monitor BPs. Restart meds  if needed Hyperlipidemia: Has extremely healthy lifestyle, negative FH CAD, on Lipitor 20 mg. CV Risk ~ 1%. Will check FLP, decrease Lipitor to 10 mg qd and consider d/c meds . Prostate cancer screening: Request a PSA, new guidelines discussed, he does like to proceed with a screening. DRE negative, check a PSA RTC 6 months, CPX (or sooner, has an appoint 12-2015) 1 care-- flu shot today

## 2015-09-07 NOTE — Telephone Encounter (Signed)
Pt has routine follow-up visit today, will refill after appt.

## 2015-09-11 ENCOUNTER — Other Ambulatory Visit (INDEPENDENT_AMBULATORY_CARE_PROVIDER_SITE_OTHER): Payer: BLUE CROSS/BLUE SHIELD

## 2015-09-11 DIAGNOSIS — R739 Hyperglycemia, unspecified: Secondary | ICD-10-CM

## 2015-09-11 DIAGNOSIS — D649 Anemia, unspecified: Secondary | ICD-10-CM | POA: Diagnosis not present

## 2015-09-11 LAB — HEMOGLOBIN A1C: Hgb A1c MFr Bld: 5.5 % (ref 4.6–6.5)

## 2015-09-21 ENCOUNTER — Encounter: Payer: Self-pay | Admitting: Medical

## 2015-09-21 ENCOUNTER — Ambulatory Visit (INDEPENDENT_AMBULATORY_CARE_PROVIDER_SITE_OTHER): Payer: BLUE CROSS/BLUE SHIELD | Admitting: Medical

## 2015-09-21 VITALS — BP 114/66 | HR 67 | Temp 98.1°F | Ht 73.0 in | Wt 183.0 lb

## 2015-09-21 DIAGNOSIS — R5383 Other fatigue: Secondary | ICD-10-CM | POA: Diagnosis not present

## 2015-09-21 LAB — POCT INFLUENZA A/B

## 2015-09-21 LAB — POCT RAPID STREP A (OFFICE): Rapid Strep A Screen: NEGATIVE

## 2015-09-21 MED ORDER — AZITHROMYCIN 250 MG PO TABS: ORAL_TABLET | ORAL | Status: DC

## 2015-09-21 NOTE — Addendum Note (Signed)
Addended by: Tasia Catchings on: 09/21/2015 03:14 PM   Modules accepted: Orders

## 2015-09-21 NOTE — Progress Notes (Signed)
Subjective:    Patient ID: Troy Leonard, male    DOB: 05/24/1971, 45 y.o.   MRN: [id number]  HPI  ST for 2 days. Hurts moderate to severe. Hurts to swallow. No significant cough.(rare here and there if any) Pt feels fatigued. No head to toe achiness.  Flu vaccine couple of weeks ago. Pt has 2 chidren 45 yo and 45 yo.  No known strep exposure.  Pt has concern for mono.  Review of Systems  Constitutional: Positive for fatigue. Negative for fever and chills.  HENT: Positive for sore throat. Negative for congestion, ear discharge, ear pain, mouth sores, nosebleeds, postnasal drip, sneezing and trouble swallowing.   Respiratory: Negative for cough, chest tightness and wheezing.   Cardiovascular: Negative for chest pain and palpitations.  Gastrointestinal: Negative for abdominal pain and blood in stool.  Musculoskeletal: Negative for myalgias.  Skin: Negative for color change and rash.  Neurological: Negative for dizziness and headaches.  Hematological: Negative for adenopathy. Does not bruise/bleed easily.  Psychiatric/Behavioral: Negative for behavioral problems and confusion.   Past Medical History  Diagnosis Date  . Hyperlipidemia   . Anxiety     sees Dr Caprice Beaver  . Hypertension   . Multiple pigmented nevi     saw derm before   . Abnormal EKG     LVH, iRBB, sees cards     Social History   Social History  . Marital Status: Married    Spouse Name: N/A  . Number of Children: 2  . Years of Education: N/A   Occupational History  . works for a Doctor, general practice    Social History Main Topics  . Smoking status: Never Smoker   . Smokeless tobacco: Never Used  . Alcohol Use: 12.6 oz/week    21 Glasses of wine per week     Comment: socially   . Drug Use: No  . Sexual Activity: Not on file   Other Topics Concern  . Not on file   Social History Narrative   Household-- pt wife, 2 children    Past Surgical History  Procedure Laterality Date  . Finger surgery    .  Vasectomy  2004    Family History  Problem Relation Age of Onset  . Heart disease Mother     44s  . Breast cancer Mother     breast  . Colon cancer Other     grandmother and grandfather  . Mitral valve prolapse Mother     s/p heart transplant after MI 3-14  . Cancer - Prostate Neg Hx   . Diabetes Neg Hx     No Known Allergies  Current Outpatient Prescriptions on File Prior to Visit  Medication Sig Dispense Refill  . atorvastatin (LIPITOR) 10 MG tablet Take 1 tablet (10 mg total) by mouth daily. 90 tablet 3  . clonazePAM (KLONOPIN) 2 MG tablet Take 2 mg by mouth daily. 2mg  by mouth daily    . Coenzyme Q10 (CO Q 10 PO) Take by mouth daily.     . Multiple Vitamin (MULTIVITAMIN) tablet Take 1 tablet by mouth daily.       No current facility-administered medications on file prior to visit.    BP 114/66 mmHg  Pulse 67  Temp(Src) 98.1 F (36.7 C) (Oral)  Ht 6\' 1"  (1.854 m)  Wt 183 lb (83.008 kg)  BMI 24.15 kg/m2  SpO2 98%       Objective:   Physical Exam   General  Mental Status -  Alert. General Appearance - Well groomed. Not in acute distress.  Skin Rashes- No Rashes.  HEENT Head- Normal. Ear Auditory Canal - Left- Normal. Right - Normal.Tympanic Membrane- Left- Normal. Right- Normal. Eye Sclera/Conjunctiva- Left- Normal. Right- Normal. Nose & Sinuses Nasal Mucosa- Left-  Not boggy or Congested. Right-  Not  boggy or Congested. Mouth & Throat Lips: Upper Lip- Normal: no dryness, cracking, pallor, cyanosis, or vesicular eruption. Lower Lip-Normal: no dryness, cracking, pallor, cyanosis or vesicular eruption. Buccal Mucosa- Bilateral- No Aphthous ulcers. Oropharynx- No Discharge or Erythema. Tonsils: Characteristics- Bilateral-  Erythema and  Congestion(slight assymetry more pronounced left side pharynx.. Size/Enlargement- Bilateral- 1+  enlargement. Discharge- bilateral-None.  Neck Neck- Supple. No Masses. Lt submandibular faint enlarged and mild  tender,   Chest and Lung Exam Auscultation: Breath Sounds:- even and unlabored.  Cardiovascular Auscultation:Rythm- Regular, rate and rhythm. Murmurs & Other Heart Sounds:Ausculatation of the heart reveal- No Murmurs.  Lymphatic Head & Neck General Head & Neck Lymphatics: Bilateral: Description- No Localized lymphadenopathy. Seen neck exam.  Abdomen- soft,nt, nd, +bs, no rebound or guarding and no organomaly.no tenderness over spleen.        Assessment & Plan:  Pt concerned for mono. I did lab testing. I did counsel with him on faint asymmetric appearance. Explained that if symptoms get worse despite tx. If more swelling then ED or UC evaluation. Pt expressed understanding.  Your strep test was negative. However, your physical exam and clinical presentation is suspicious for strep and it is important to note that rapid strep test can be falsely negative. So I am going to give you azithromycin  antibiotic today based on your exam and clinical presentation.  Rest hydrate, tylenol for fever, and warm salt water gargles.   For possible mono will go ahead and get lab at your request since you will be out of town next week.  Follow up in 7 days or as needed.

## 2015-09-21 NOTE — Addendum Note (Signed)
Addended by: Tasia Catchings on: 09/21/2015 02:11 PM   Modules accepted: Orders

## 2015-09-21 NOTE — Progress Notes (Signed)
Pre visit review using our clinic review tool, if applicable. No additional management support is needed unless otherwise documented below in the visit note. 

## 2015-09-21 NOTE — Patient Instructions (Signed)
Your strep test was negative. However, your physical exam and clinical presentation is suspicious for strep and it is important to note that rapid strep test can be falsely negative. So I am going to give you azithromycin  antibiotic today based on your exam and clinical presentation.  Rest hydrate, tylenol for fever, and warm salt water gargles.   For possible mono will go ahead and get lab at your request since you will be out of town next week.  Follow up in 7 days or as needed.

## 2015-09-22 LAB — EPSTEIN-BARR VIRUS VCA ANTIBODY PANEL
EBV NA IgG: 79.4 U/mL — ABNORMAL HIGH (ref <18.0)
EBV VCA IGG: 132 U/mL — AB (ref <18.0)
EBV VCA IgM: <10 U/mL (ref <36.0)

## 2015-09-26 NOTE — Progress Notes (Signed)
Quick Note:  Pt has seen results on MyChart and message also sent for patient to call back if any questions. ______ 

## 2015-12-13 ENCOUNTER — Telehealth: Payer: Self-pay | Admitting: *Deleted

## 2015-12-13 NOTE — Telephone Encounter (Signed)
Unable to reach patient at time of pre-visit call. Left message for patient to return call when available.  

## 2015-12-14 ENCOUNTER — Encounter: Payer: Self-pay | Admitting: Internal Medicine

## 2015-12-14 ENCOUNTER — Encounter: Payer: Self-pay | Admitting: *Deleted

## 2015-12-14 ENCOUNTER — Ambulatory Visit (INDEPENDENT_AMBULATORY_CARE_PROVIDER_SITE_OTHER): Payer: BLUE CROSS/BLUE SHIELD | Admitting: Internal Medicine

## 2015-12-14 VITALS — BP 108/74 | HR 45 | Temp 98.4°F | Ht 73.0 in | Wt 189.4 lb

## 2015-12-14 DIAGNOSIS — Z0000 Encounter for general adult medical examination without abnormal findings: Secondary | ICD-10-CM | POA: Diagnosis not present

## 2015-12-14 DIAGNOSIS — D649 Anemia, unspecified: Secondary | ICD-10-CM

## 2015-12-14 DIAGNOSIS — Z114 Encounter for screening for human immunodeficiency virus [HIV]: Secondary | ICD-10-CM

## 2015-12-14 NOTE — Progress Notes (Signed)
Pre visit review using our clinic review tool, if applicable. No additional management support is needed unless otherwise documented below in the visit note. 

## 2015-12-14 NOTE — Assessment & Plan Note (Signed)
Td-- 2012  FH---one GGP w/ h/o colon cancer (age mid 52s). Has another GP w/ same, age? No FH  colon polyps. Never had a colonoscopy DRE and PSA within normal 08-2015 Doing great with diet and exercise  Labs : FLP, iron, ferritin, hemoglobin and HIV RTC one year

## 2015-12-14 NOTE — Patient Instructions (Signed)
GO TO THE FRONT DESK Schedule your next appointment for a  Physical exam in 1 year  Schedule labs to be done within few days , fasting

## 2015-12-14 NOTE — Telephone Encounter (Signed)
Pre-Visit Call completed with patient and chart updated.   Pre-Visit Info documented in Specialty Comments under [employer].    

## 2015-12-14 NOTE — Progress Notes (Signed)
Subjective:    Patient ID: Troy Leonard, male    DOB: [birth year], 45 y.o.   MRN: [id number]  DOS:  12/14/2015 Type of visit - description : CPX Interval history:  No major concerns  Review of Systems  Constitutional: No fever. No chills. No unexplained wt changes. No unusual sweats  HEENT: No dental problems, no ear discharge, no facial swelling, no voice changes. No eye discharge, no eye  redness , no  intolerance to light   Respiratory: No wheezing , no  difficulty breathing. No cough , no mucus production  Cardiovascular: No CP, no leg swelling , no  Palpitations  GI: no nausea, no vomiting, no diarrhea , no  abdominal pain.  No blood in the stools. No dysphagia, no odynophagia    Endocrine: No polyphagia, no polyuria , no polydipsia  GU: No dysuria, gross hematuria, difficulty urinating. No urinary urgency, no frequency.  Musculoskeletal: No joint swellings or unusual aches or pains  Skin: No change in the color of the skin, palor , no  Rash  Allergic, immunologic: No environmental allergies , no  food allergies  Neurological: No dizziness no  syncope. No headaches. No diplopia, no slurred, no slurred speech, no motor deficits, no facial  Numbness  Hematological: No enlarged lymph nodes, no easy bruising , no unusual bleedings  Psychiatry: No suicidal ideas, no hallucinations, no beavior problems, no confusion.  No unusual/severe anxiety, no depression  Past Medical History  Diagnosis Date  . Hyperlipidemia   . Anxiety     sees Dr Caprice Beaver  . Hypertension   . Multiple pigmented nevi     saw derm before   . Abnormal EKG     LVH, iRBB, sees cards     Past Surgical History  Procedure Laterality Date  . Finger surgery    . Vasectomy  2004    Social History   Social History  . Marital Status: Married    Spouse Name: N/A  . Number of Children: 2  . Years of Education: N/A   Occupational History  . works for a Doctor, general practice    Social History Main  Topics  . Smoking status: Never Smoker   . Smokeless tobacco: Never Used  . Alcohol Use: 12.6 oz/week    21 Glasses of wine per week     Comment: socially   . Drug Use: No  . Sexual Activity: Not on file   Other Topics Concern  . Not on file   Social History Narrative   Household-- pt wife, 2 children   Family History  Problem Relation Age of Onset  . Heart disease Mother     20s  . Breast cancer Mother     breast  . Colon cancer Other     grandmother and grandfather  . Mitral valve prolapse Mother     s/p heart transplant after MI 3-14  . Cancer - Prostate Neg Hx   . Diabetes Neg Hx         Medication List       This list is accurate as of: 12/14/15 11:59 PM.  Always use your most recent med list.               arginine 500 MG tablet  Take 500 mg by mouth daily.     atorvastatin 10 MG tablet  Commonly known as:  LIPITOR  Take 1 tablet (10 mg total) by mouth daily.     clonazePAM 2 MG tablet  Commonly known as:  KLONOPIN  Take 2 mg by mouth daily. 2mg  by mouth daily     CO Q 10 PO  Take by mouth daily.     multivitamin tablet  Take 1 tablet by mouth daily.           Objective:   Physical Exam BP 108/74 mmHg  Pulse 45  Temp(Src) 98.4 F (36.9 C) (Oral)  Ht 6\' 1"  (1.854 m)  Wt 189 lb 6 oz (85.9 kg)  BMI 24.99 kg/m2  SpO2 99%  General:   Well developed, well nourished . NAD.  Neck: No  thyromegaly  HEENT:  Normocephalic . Face symmetric, atraumatic Lungs:  CTA B Normal respiratory effort, no intercostal retractions, no accessory muscle use. Heart: RRR,  no murmur.  No pretibial edema bilaterally  Abdomen:  Not distended, soft, non-tender. No rebound or rigidity.   Skin: Exposed areas without rash. Not pale. Not jaundice Neurologic:  alert & oriented X3.  Speech normal, gait appropriate for age and unassisted Strength symmetric and appropriate for age.  Psych: Cognition and judgment appear intact.  Cooperative with normal attention  span and concentration.  Behavior appropriate. No anxious or depressed appearing.    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 Caprice Beaver Multiple nevus, sees derm  PLAN: HTN: On no medications, rec amb BPs Hyperlipidemia: Last FLP satisfactory on Lipitor 20 mg, now on 10 mg, recheck labs Mild anemia: Check iron, ferritin, hemoglobin. RTC one year

## 2015-12-14 NOTE — Addendum Note (Signed)
Addended by: Dorrene German on: 12/14/2015 10:42 AM   Modules accepted: Orders, Medications

## 2015-12-15 ENCOUNTER — Other Ambulatory Visit (INDEPENDENT_AMBULATORY_CARE_PROVIDER_SITE_OTHER): Payer: BLUE CROSS/BLUE SHIELD

## 2015-12-15 DIAGNOSIS — Z114 Encounter for screening for human immunodeficiency virus [HIV]: Secondary | ICD-10-CM

## 2015-12-15 DIAGNOSIS — Z0000 Encounter for general adult medical examination without abnormal findings: Secondary | ICD-10-CM

## 2015-12-15 DIAGNOSIS — D649 Anemia, unspecified: Secondary | ICD-10-CM

## 2015-12-15 LAB — LIPID PANEL
Cholesterol: 164 mg/dL (ref 0–200)
HDL: 65.2 mg/dL (ref >39.00)
LDL Cholesterol: 88 mg/dL (ref 0–99)
NONHDL: 98.39
Total CHOL/HDL Ratio: 3
Triglycerides: 50 mg/dL (ref 0.0–149.0)
VLDL: 10 mg/dL (ref 0.0–40.0)

## 2015-12-15 LAB — IRON: IRON: 67 ug/dL (ref 42–165)

## 2015-12-15 LAB — HEMOGLOBIN: HEMOGLOBIN: 13.9 g/dL (ref 13.0–17.0)

## 2015-12-15 LAB — FERRITIN: FERRITIN: 134.9 ng/mL (ref 22.0–322.0)

## 2015-12-15 LAB — HIV ANTIBODY (ROUTINE TESTING W REFLEX): HIV: NONREACTIVE

## 2015-12-15 NOTE — Assessment & Plan Note (Signed)
HTN: On no medications, rec amb BPs Hyperlipidemia: Last FLP satisfactory on Lipitor 20 mg, now on 10 mg, recheck labs Mild anemia: Check iron, ferritin, hemoglobin. RTC one year

## 2016-06-04 ENCOUNTER — Telehealth: Payer: Self-pay | Admitting: Cardiovascular Disease

## 2016-06-04 NOTE — Telephone Encounter (Signed)
New message      Pt received a recall letter to schedule appt with Dr Angelena Form (no appts available). Please call

## 2016-06-05 NOTE — Telephone Encounter (Signed)
Left message to call back  

## 2016-06-13 NOTE — Telephone Encounter (Signed)
I spoke with Troy Leonard and made appt for him to see Dr. Angelena Form on 08/09/16 at 9:00

## 2016-07-18 ENCOUNTER — Ambulatory Visit (INDEPENDENT_AMBULATORY_CARE_PROVIDER_SITE_OTHER): Payer: BLUE CROSS/BLUE SHIELD | Admitting: Behavioral Health

## 2016-07-18 DIAGNOSIS — Z23 Encounter for immunization: Secondary | ICD-10-CM

## 2016-08-02 ENCOUNTER — Ambulatory Visit: Payer: BLUE CROSS/BLUE SHIELD | Admitting: Cardiovascular Disease

## 2016-08-05 ENCOUNTER — Ambulatory Visit: Payer: BLUE CROSS/BLUE SHIELD | Admitting: Cardiovascular Disease

## 2016-08-09 ENCOUNTER — Encounter: Payer: Self-pay | Admitting: Cardiovascular Disease

## 2016-08-09 ENCOUNTER — Ambulatory Visit (INDEPENDENT_AMBULATORY_CARE_PROVIDER_SITE_OTHER): Payer: BLUE CROSS/BLUE SHIELD | Admitting: Cardiovascular Disease

## 2016-08-09 VITALS — BP 110/66 | HR 51 | Ht 73.0 in | Wt 189.6 lb

## 2016-08-09 DIAGNOSIS — I10 Essential (primary) hypertension: Secondary | ICD-10-CM | POA: Diagnosis not present

## 2016-08-09 DIAGNOSIS — I4510 Unspecified right bundle-branch block: Secondary | ICD-10-CM

## 2016-08-09 DIAGNOSIS — I351 Nonrheumatic aortic (valve) insufficiency: Secondary | ICD-10-CM

## 2016-08-09 NOTE — Progress Notes (Signed)
Chief Complaint  Patient presents with  . Follow-up     History of Present Illness: 46 yo white male with a history of anxiety, hyperlipidemia and HTN who is here today for cardiac follow up. He was initially seen in our office November 2009 for an abnormal EKG. At that time he denied having any chest pain, but did complain of occasional shortness of breath when he was having episodes of anxiety. Echo was overall normal with mild AI. Most recent echo June 2016 with normal LV systolic function, trivial AI.   He returns today for follow up. He denies SOB or chest pain. He has been exercising daily. He runs several days per week.    Primary Care Physician: Kathlene November, MD   Past Medical History:  Diagnosis Date  . Abnormal EKG    LVH, iRBB, sees cards   . Anxiety    sees Dr Caprice Beaver  . Hyperlipidemia   . Hypertension   . Multiple pigmented nevi    saw derm before     Past Surgical History:  Procedure Laterality Date  . FINGER SURGERY    . VASECTOMY  2004    Current Outpatient Prescriptions  Medication Sig Dispense Refill  . arginine 500 MG tablet Take 500 mg by mouth daily.    Marland Kitchen atorvastatin (LIPITOR) 10 MG tablet Take 1 tablet (10 mg total) by mouth daily. 90 tablet 3  . clonazePAM (KLONOPIN) 2 MG tablet Take 2 mg by mouth daily. 2mg  by mouth daily    . Coenzyme Q10 (CO Q 10 PO) Take 1 Dose by mouth daily.     . Multiple Vitamin (MULTIVITAMIN) tablet Take 1 tablet by mouth daily.       No current facility-administered medications for this visit.     No Known Allergies  Social History   Social History  . Marital status: Married    Spouse name: N/A  . Number of children: 2  . Years of education: N/A   Occupational History  . 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 Smoking status: Never Smoker  . Smokeless tobacco: Never Used  . Alcohol use 12.6 oz/week    21 Glasses of wine per week     Comment: socially   . Drug use: No  .  Sexual activity: Not on file   Other Topics Concern  . Not on file   Social History Narrative   Household-- pt wife, 2 children    Family History  Problem Relation Age of Onset  . Heart disease Mother     12s  . Breast cancer Mother     breast  . Colon cancer Other     grandmother and grandfather  . Mitral valve prolapse Mother     s/p heart transplant after MI 3-14  . Cancer - Prostate Neg Hx   . Diabetes Neg Hx     Review of Systems:  As stated in the HPI and otherwise negative.   BP 110/66   Pulse (!) 51   Ht 6\' 1"  (1.854 m)   Wt 189 lb 9.6 oz (86 kg)   BMI 25.01 kg/m   Physical Examination: General: Well developed, well nourished, NAD  HEENT: OP clear, mucus membranes moist  SKIN: warm, dry. No rashes. Neuro: No focal deficits  Musculoskeletal: Muscle strength 5/5 all ext  Psychiatric: Mood and affect normal  Neck: No JVD, no carotid bruits, no thyromegaly, no lymphadenopathy.  Lungs:Clear  bilaterally, no wheezes, rhonci, crackles Cardiovascular: Regular rate and rhythm. No murmurs, gallops or rubs. Abdomen:Soft. Bowel sounds present. Non-tender.  Extremities: No lower extremity edema. Pulses are 2 + in the bilateral DP/PT.  Echo June 2016: Left ventricle: The cavity size was normal. Wall thickness was   normal. Systolic function was normal. The estimated ejection   fraction was in the range of 50% to 55%. Wall motion was normal;   there were no regional wall motion abnormalities. - Aortic valve: There was trivial regurgitation. - Left atrium: The atrium was mildly dilated. - Right atrium: The atrium was mildly dilated.  EKG:  EKG is ordered today. The ekg ordered today demonstrates sinus brady, rate 51 bpm. Incomplete RBBB  Recent Labs: 09/07/2015: ALT 19; BUN 16; Creatinine, Ser 0.94; Platelets 170.0; Potassium 3.9; Sodium 140 12/15/2015: Hemoglobin 13.9   Lipid Panel    Component Value Date/Time   CHOL 164 12/15/2015 0724   TRIG 50.0 12/15/2015  0724   HDL 65.20 12/15/2015 0724   CHOLHDL 3 12/15/2015 0724   VLDL 10.0 12/15/2015 0724   LDLCALC 88 12/15/2015 0724   LDLDIRECT 171.4 10/08/2012 1124     Wt Readings from Last 3 Encounters:  08/09/16 189 lb 9.6 oz (86 kg)  12/14/15 189 lb 6 oz (85.9 kg)  09/21/15 183 lb (83 kg)     Other studies Reviewed: Additional studies/ records that were reviewed today include: . Review of the above records demonstrates:   Assessment and Plan:   1. RBBB: Normal LV and RV by echo 2013.  2. HYPERTENSION: BP well controlled.   3. Aortic insufficiency: Trivial to mild by echo 2016.    Current medicines are reviewed at length with the patient today.  The patient does not have concerns regarding medicines.  The following changes have been made:  no change  Labs/ tests ordered today include:   Orders Placed This Encounter  Procedures  . EKG 12-Lead     Disposition:   FU with me in 2 years   Signed, Lauree Chandler, MD 08/09/2016 10:10 AM    [facility] [address], [place], [place]  [address] Phone: [phone]; Fax: [phone]

## 2016-08-09 NOTE — Patient Instructions (Signed)
Medication Instructions:  Your physician recommends that you continue on your current medications as directed. Please refer to the Current Medication list given to you today.   Labwork: none  Testing/Procedures: none  Follow-Up: Your physician recommends that you schedule a follow-up appointment in: 2 years.  Please call our office in September 2019 to schedule this appointment.     Any Other Special Instructions Will Be Listed Below (If Applicable).     If you need a refill on your cardiac medications before your next appointment, please call your pharmacy.

## 2016-09-09 ENCOUNTER — Other Ambulatory Visit: Payer: Self-pay | Admitting: Internal Medicine

## 2016-12-20 ENCOUNTER — Encounter: Payer: Self-pay | Admitting: Internal Medicine

## 2016-12-20 ENCOUNTER — Ambulatory Visit (INDEPENDENT_AMBULATORY_CARE_PROVIDER_SITE_OTHER): Payer: BLUE CROSS/BLUE SHIELD | Admitting: Internal Medicine

## 2016-12-20 VITALS — BP 118/74 | HR 54 | Temp 97.8°F | Resp 14 | Ht 73.0 in | Wt 190.4 lb

## 2016-12-20 DIAGNOSIS — Z0000 Encounter for general adult medical examination without abnormal findings: Secondary | ICD-10-CM | POA: Diagnosis not present

## 2016-12-20 DIAGNOSIS — Z800 Family history of malignant neoplasm of digestive organs: Secondary | ICD-10-CM

## 2016-12-20 LAB — COMPREHENSIVE METABOLIC PANEL
ALBUMIN: 4.6 g/dL (ref 3.5–5.2)
ALK PHOS: 48 U/L (ref 39–117)
ALT: 27 U/L (ref 0–53)
AST: 19 U/L (ref 0–37)
BILIRUBIN TOTAL: 0.5 mg/dL (ref 0.2–1.2)
BUN: 16 mg/dL (ref 6–23)
CO2: 30 mEq/L (ref 19–32)
CREATININE: 0.99 mg/dL (ref 0.40–1.50)
Calcium: 9.4 mg/dL (ref 8.4–10.5)
Chloride: 105 mEq/L (ref 96–112)
GFR: 86.67 mL/min (ref >60.00)
Glucose, Bld: 110 mg/dL — ABNORMAL HIGH (ref 70–99)
Potassium: 3.8 mEq/L (ref 3.5–5.1)
SODIUM: 140 meq/L (ref 135–145)
TOTAL PROTEIN: 6.8 g/dL (ref 6.0–8.3)

## 2016-12-20 LAB — CBC WITH DIFFERENTIAL/PLATELET
BASOS PCT: 0.9 % (ref 0.0–3.0)
Basophils Absolute: 0 10*3/uL (ref 0.0–0.1)
EOS ABS: 0.1 10*3/uL (ref 0.0–0.7)
Eosinophils Relative: 1 % (ref 0.0–5.0)
HCT: 41.8 % (ref 39.0–52.0)
HEMOGLOBIN: 14.3 g/dL (ref 13.0–17.0)
LYMPHS PCT: 24.5 % (ref 12.0–46.0)
Lymphs Abs: 1.4 10*3/uL (ref 0.7–4.0)
MCHC: 34.2 g/dL (ref 30.0–36.0)
MCV: 90.6 fl (ref 78.0–100.0)
Monocytes Absolute: 0.4 10*3/uL (ref 0.1–1.0)
Monocytes Relative: 7.3 % (ref 3.0–12.0)
Neutro Abs: 3.7 10*3/uL (ref 1.4–7.7)
Neutrophils Relative %: 66.3 % (ref 43.0–77.0)
Platelets: 166 10*3/uL (ref 150.0–400.0)
RBC: 4.61 Mil/uL (ref 4.22–5.81)
RDW: 12.6 % (ref 11.5–15.5)
WBC: 5.6 10*3/uL (ref 4.0–10.5)

## 2016-12-20 LAB — LIPID PANEL
CHOLESTEROL: 166 mg/dL (ref 0–200)
HDL: 57.6 mg/dL (ref >39.00)
LDL CALC: 96 mg/dL (ref 0–99)
NonHDL: 108.76
Total CHOL/HDL Ratio: 3
Triglycerides: 62 mg/dL (ref 0.0–149.0)
VLDL: 12.4 mg/dL (ref 0.0–40.0)

## 2016-12-20 LAB — TSH: TSH: 2.14 u[IU]/mL (ref 0.35–4.50)

## 2016-12-20 NOTE — Assessment & Plan Note (Addendum)
--  Td-- 2012  --CCS: + FH, patient interested in CCS, 3 options discussed, elected to GI referral. If unable to pursue a colonoscopy, will call for a Hemoccult. --DRE and PSA within normal 08-2015 --Doing great with diet and exercise  Labs :  CMP, FLP, CBC, TSH RTC one year

## 2016-12-20 NOTE — Patient Instructions (Signed)
GO TO THE LAB : Get the blood work     GO TO THE FRONT DESK Schedule your next appointment for a  Physical exam , 1 year

## 2016-12-20 NOTE — Progress Notes (Signed)
Subjective:    Patient ID: Troy Leonard, male    DOB: [birth year], 46 y.o.   MRN: [id number]  DOS:  12/20/2016 Type of visit - description :  cpx Interval history: No concerns   Review of Systems Run a half marathon few weeks ago, did very well, has some feet and ankle soreness but nothing persistent.   Other than above, a 14 point review of systems is negative     Past Medical History:  Diagnosis Date  . Abnormal EKG    LVH, iRBB, sees cards   . Anxiety    sees Dr Caprice Beaver  . Hyperlipidemia   . Hypertension   . Multiple pigmented nevi    saw derm before     Past Surgical History:  Procedure Laterality Date  . FINGER SURGERY    . VASECTOMY  2004    Social History   Social History  . Marital status: Married    Spouse name: N/A  . Number of children: 2  . Years of education: N/A   Occupational History  . 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 Smoking status: Never Smoker  . Smokeless tobacco: Never Used  . Alcohol use 12.6 oz/week    21 Glasses of wine per week     Comment: socially   . Drug use: No  . Sexual activity: Not on file   Other Topics Concern  . Not on file   Social History Narrative   Household-- pt wife, 2 children, 32-15 y/o     Family History  Problem Relation Age of Onset  . Heart disease Mother        40s  . Breast cancer Mother        breast  . Mitral valve prolapse Mother        s/p heart transplant after MI 3-14  . Colon cancer Other        grandmother and grandfather  . Cancer - Prostate Neg Hx   . Diabetes Neg Hx      Allergies as of 12/20/2016   No Known Allergies     Medication List       Accurate as of 12/20/16 11:59 PM. Always use your most recent med list.          arginine 500 MG tablet Take 500 mg by mouth daily.   atorvastatin 10 MG tablet Commonly known as:  LIPITOR Take 1 tablet (10 mg total) by mouth daily.   clonazePAM 2 MG tablet Commonly known as:  KLONOPIN Take  2 mg by mouth 3 (three) times daily as needed.   CO Q 10 PO Take 1 Dose by mouth daily.   multivitamin tablet Take 1 tablet by mouth daily.   TURMERIC CURCUMIN PO Take by mouth.          Objective:   Physical Exam BP 118/74 (BP Location: Left Arm, Patient Position: Sitting, Cuff Size: Normal)   Pulse (!) 54   Temp 97.8 F (36.6 C) (Oral)   Resp 14   Ht 6\' 1"  (1.854 m)   Wt 190 lb 6 oz (86.4 kg)   SpO2 97%   BMI 25.12 kg/m  General:   Well developed, well nourished . NAD.  Neck: No  thyromegaly  HEENT:  Normocephalic . Face symmetric, atraumatic Lungs:  CTA B Normal respiratory effort, no intercostal retractions, no accessory muscle use. Heart: RRR,  no murmur.  No pretibial edema bilaterally  Abdomen:  Not distended, soft, non-tender. No rebound or rigidity.   Skin: Exposed areas without rash. Not pale. Not jaundice Neurologic:  alert & oriented X3.  Speech normal, gait appropriate for age and unassisted Strength symmetric and appropriate for age.  Psych: Cognition and judgment appear intact.  Cooperative with normal attention span and concentration.  Behavior appropriate. No anxious or depressed appearing.     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 Hoover Browns Multiple nevus, sees derm  PLAN: HTN?: nl BPs, on no meds Hyperlipidemia: On Lipitor, checking labs CV: Saw cardiology 07-2016, good reports, follow-up 2 years RTC one year

## 2016-12-20 NOTE — Progress Notes (Signed)
Pre visit review using our clinic review tool, if applicable. No additional management support is needed unless otherwise documented below in the visit note. 

## 2016-12-22 NOTE — Assessment & Plan Note (Signed)
HTN?: nl BPs, on no meds Hyperlipidemia: On Lipitor, checking labs CV: Saw cardiology 07-2016, good reports, follow-up 2 years RTC one year

## 2017-02-04 ENCOUNTER — Encounter: Payer: Self-pay | Admitting: Gastroenterology

## 2017-03-04 ENCOUNTER — Ambulatory Visit (INDEPENDENT_AMBULATORY_CARE_PROVIDER_SITE_OTHER): Payer: BLUE CROSS/BLUE SHIELD | Admitting: Gastroenterology

## 2017-03-04 ENCOUNTER — Encounter: Payer: Self-pay | Admitting: Gastroenterology

## 2017-03-04 VITALS — BP 122/64 | HR 64 | Ht 73.0 in | Wt 197.2 lb

## 2017-03-04 DIAGNOSIS — Z1211 Encounter for screening for malignant neoplasm of colon: Secondary | ICD-10-CM | POA: Diagnosis not present

## 2017-03-04 NOTE — Progress Notes (Signed)
[facility] Consult Note:  History: Troy Leonard 03/04/2017  Referring physician: Colon Branch, MD  Reason for consult/chief complaint: Colon Cancer Screening (has a family history of colon cancer-Paternal grandmother- dx at age 46; thinks father may have had colon polyps in the past currently age 26 but this would have been done in the last 47 years; patient does not have any gi concerns at this time.)   Subjective  HPI:  This is a 46 year old man referred by primary care noted above for consideration of colon cancer screening and family history of colon cancer. Troy Leonard was had a recent primary care appointment and asked about the recent ACS statement offering a conditional recommendation of starting colon cancer screening at age 46. He had a grandparent with colon cancer diagnosed in her early 58s. A grandparent on the other side may have had something related to his colon but the history is unclear. Troy Leonard denies abdominal pain, altered bowel habits, rectal bleeding, frequent reflux, nausea, vomiting, dysphagia or any other chronic digestive symptoms.   ROS:  Review of Systems He denies chest pain or dyspnea. Intermittent anxiety  Past Medical History: Past Medical History:  Diagnosis Date  . Abnormal EKG    LVH, iRBB, sees cards   . Anxiety    sees Dr Caprice Beaver  . Hyperlipidemia   . Hypertension   . Multiple pigmented nevi    saw derm before      Past Surgical History: Past Surgical History:  Procedure Laterality Date  . FINGER SURGERY    . VASECTOMY  2004     Family History: Family History  Problem Relation Age of Onset  . Colon polyps Father        patient believes father may have had around age 37  . Heart disease Mother        50s  . Breast cancer Mother        breast  . Mitral valve prolapse Mother        s/p heart transplant after MI 3-14  . Colon cancer Other        grandmother and grandfather?  . Cancer - Prostate Neg Hx   .  Diabetes Neg Hx     Social History: Social History   Social History  . Marital status: Married    Spouse name: N/A  . Number of children: 2  . Years of education: N/A   Occupational History  . 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 Smoking status: Never Smoker  . Smokeless tobacco: Never Used  . Alcohol use 12.6 oz/week    21 Glasses of wine per week     Comment: socially   . Drug use: No  . Sexual activity: Not Asked   Other Topics Concern  . None   Social History Narrative   Household-- pt wife, 2 children, 76-15 y/o   Works for [employer]  Allergies: No Known Allergies  Outpatient Meds: Current Outpatient Prescriptions  Medication Sig Dispense Refill  . arginine 500 MG tablet Take 500 mg by mouth daily.    Marland Kitchen atorvastatin (LIPITOR) 10 MG tablet Take 1 tablet (10 mg total) by mouth daily. 90 tablet 1  . clonazePAM (KLONOPIN) 2 MG tablet Take 2 mg by mouth 3 (three) times daily as needed.     . Coenzyme Q10 (CO Q 10 PO) Take 1 Dose by mouth daily.     . Multiple Vitamin (MULTIVITAMIN)  tablet Take 1 tablet by mouth daily.      . psyllium (METAMUCIL) 58.6 % powder 1 teaspoon once daily    . TURMERIC CURCUMIN PO Take 1 capsule by mouth daily.      No current facility-administered medications for this visit.       ___________________________________________________________________ Objective   Exam:  BP 122/64   Pulse 64   Ht 6\' 1"  (1.854 m)   Wt 197 lb 3.2 oz (89.4 kg)   BMI 26.02 kg/m    General: this is a(n) Well-appearing man   Eyes: sclera anicteric, no redness  ENT: oral mucosa moist without lesions, no cervical or supraclavicular lymphadenopathy, good dentition  CV: RRR without murmur, S1/S2, no JVD, no peripheral edema  Resp: clear to auscultation bilaterally, normal RR and effort noted  GI: soft, no tenderness, with active bowel sounds. No guarding or palpable organomegaly noted.  Skin; warm and  dry, no rash or jaundice noted  Neuro: awake, alert and oriented x 3. Normal gross motor function and fluent speech  CBC Latest Ref Rng & Units 12/20/2016 12/15/2015 09/07/2015  WBC 4.0 - 10.5 K/uL 5.6 - 5.0  Hemoglobin 13.0 - 17.0 g/dL 14.3 13.9 12.9(L)  Hematocrit 39.0 - 52.0 % 41.8 - 37.8(L)  Platelets 150.0 - 400.0 K/uL 166.0 - 170.0    Assessment: Encounter Diagnosis  Name Primary?  . Special screening for malignant neoplasms, colon Yes    His family history still puts him at average risk for colorectal cancer. Despite the recent ACS statement, GI professional societies and USPSTF have not felt that the retrospective data upon which that conditional recognition was based is strong enough to warrant screening the general population at age 46. Therefore, the current screening age for average risk individuals remains at age 49.  I explain this all to him, and he will plan to see [place] at age 46 for colonoscopy, or sooner if symptoms arise.  Thank you for the courtesy of this consult.  Please call me with any questions or concerns.  Troy Leonard  CC: Colon Branch, MD

## 2017-03-04 NOTE — Patient Instructions (Signed)
If you are age 46 or older, your body mass index should be between 23-30. Your Body mass index is 26.02 kg/m. If this is out of the aforementioned range listed, please consider follow up with your Primary Care Provider.  If you are age 23 or younger, your body mass index should be between 19-25. Your Body mass index is 26.02 kg/m. If this is out of the aformentioned range listed, please consider follow up with your Primary Care Provider.   Thank you for choosing [facility]  Dr Wilfrid Lund III

## 2017-03-07 ENCOUNTER — Other Ambulatory Visit: Payer: Self-pay | Admitting: Internal Medicine

## 2017-04-03 ENCOUNTER — Telehealth: Payer: Self-pay | Admitting: Internal Medicine

## 2017-04-03 ENCOUNTER — Encounter: Payer: Self-pay | Admitting: Family Medicine

## 2017-04-03 ENCOUNTER — Ambulatory Visit (INDEPENDENT_AMBULATORY_CARE_PROVIDER_SITE_OTHER): Payer: BLUE CROSS/BLUE SHIELD | Admitting: Family Medicine

## 2017-04-03 VITALS — BP 112/66 | HR 84 | Temp 99.6°F | Ht 73.0 in | Wt 192.8 lb

## 2017-04-03 DIAGNOSIS — J020 Streptococcal pharyngitis: Secondary | ICD-10-CM | POA: Diagnosis not present

## 2017-04-03 DIAGNOSIS — R070 Pain in throat: Secondary | ICD-10-CM | POA: Diagnosis not present

## 2017-04-03 HISTORY — DX: Pain in throat: R07.0

## 2017-04-03 LAB — POCT RAPID STREP A (OFFICE): Rapid Strep A Screen: POSITIVE — AB

## 2017-04-03 LAB — MONONUCLEOSIS SCREEN: Mono Screen: NEGATIVE

## 2017-04-03 MED ORDER — AMOXICILLIN 500 MG PO CAPS: 1000.0000 mg | ORAL_CAPSULE | Freq: Every day | ORAL | 0 refills | Status: DC

## 2017-04-03 NOTE — Telephone Encounter (Signed)
Pt called in. He said that his pharmacy is requesting a call before filling Rx for amoxicillin  because they need clarity on the Rx directions.     Pharmacy is [id number] - [place], [place]

## 2017-04-03 NOTE — Progress Notes (Signed)
Troy Leonard - 46 y.o. male MRN [id number]  Date of birth: [birth year]  SUBJECTIVE:  Including CC & ROS.  Chief Complaint  Patient presents with  . Sore Throat    Patient is here today C/O throat pain for 2d.  Also feels tired and weak.  Has 2 high school children and also states that daughter had Mono in May and would like to check that.    Troy Leonard is a 46 year old malepresenting with sore throat. His symptoms started 2 days ago. Has progressively gotten worse. Denies any fevers. Has pain with swallowing. Denies any chest pain or shortness of breath. Is having some ear fullness. He reports his daughter had mono in the spring. Has not traveled recently.     Review of Systems  Constitutional: Negative for fever.  HENT: Positive for sore throat.     HISTORY: Past Medical, Surgical, Social, and Family History Reviewed & Updated per EMR.   Pertinent Historical Findings include:  Past Medical History:  Diagnosis Date  . Abnormal EKG    LVH, iRBB, sees cards   . Anxiety    sees Dr Caprice Beaver  . Hyperlipidemia   . Hypertension   . Multiple pigmented nevi    saw derm before     Past Surgical History:  Procedure Laterality Date  . FINGER SURGERY    . VASECTOMY  2004    No Known Allergies  Family History  Problem Relation Age of Onset  . Colon polyps Father        patient believes father may have had around age 54  . Heart disease Mother        30s  . Breast cancer Mother        breast  . Mitral valve prolapse Mother        s/p heart transplant after MI 3-14  . Colon cancer Other        grandmother and grandfather?  . Cancer - Prostate Neg Hx   . Diabetes Neg Hx      Social History   Social History  . Marital status: Married    Spouse name: N/A  . Number of children: 2  . Years of education: N/A   Occupational History  . 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 Smoking status: Never Smoker  . Smokeless tobacco: Never Used  .  Alcohol use 12.6 oz/week    21 Glasses of wine per week     Comment: socially   . Drug use: No  . Sexual activity: Not on file   Other Topics Concern  . Not on file   Social History Narrative   Household-- pt wife, 2 children, 17-15 y/o     PHYSICAL EXAM:  VS: BP 112/66 (BP Location: Right Arm, Patient Position: Sitting, Cuff Size: Normal)   Pulse 84   Temp 99.6 F (37.6 C) (Oral)   Ht 6\' 1"  (1.854 m)   Wt 192 lb 12.8 oz (87.5 kg)   SpO2 97%   BMI 25.44 kg/m  Physical Exam Gen: NAD, alert, cooperative with exam,  ENT: normal lips, normal nasal mucosa, tympanic membranes clear and intact bilaterally, no cervical lymphadenopathy, normal nasal turbinates, left tonsil hypertrophy with no exudates Eye: normal EOM, normal conjunctiva and lids CV:  no edema, +2 pedal pulses , S1-S2, regular rate and rhythm  Resp: no accessory muscle use, non-labored, clear to auscultation bilaterally, no crackles or wheezes Skin: no rashes, no areas  of induration  Neuro: normal tone, normal sensation to touch Psych:  normal insight, alert and oriented MSK: Normal gait, normal strength     ASSESSMENT & PLAN:   Throat pain Strep test was positive but his daughter did have mono earlier this spring. - Amoxicillin sent - Mono spot test

## 2017-04-03 NOTE — Patient Instructions (Signed)
Thank you for coming in,   Please take the antibiotic for the full 10 days. Please let us know if you develop a rash.   Please take a probiotic with the antibiotic.   We will call you with the results from today.    Please feel free to call with any questions or concerns at any time, at [phone]. --Dr. Raeford Razor

## 2017-04-03 NOTE — Assessment & Plan Note (Addendum)
Strep test was positive but his daughter did have mono earlier this spring. - Amoxicillin sent - Mono spot test

## 2017-04-04 NOTE — Telephone Encounter (Signed)
I called pharmacy and gave verbal for #20 per JS/thx dmf

## 2017-12-05 ENCOUNTER — Other Ambulatory Visit: Payer: Self-pay | Admitting: Internal Medicine

## 2017-12-23 ENCOUNTER — Encounter: Payer: BLUE CROSS/BLUE SHIELD | Admitting: Internal Medicine

## 2018-01-22 ENCOUNTER — Telehealth: Payer: Self-pay

## 2018-01-22 DIAGNOSIS — Z0000 Encounter for general adult medical examination without abnormal findings: Secondary | ICD-10-CM

## 2018-01-22 NOTE — Telephone Encounter (Signed)
Copied from [place] [id number]. Topic: Appointment Scheduling - Scheduling Inquiry for Clinic >> Jan 22, 2018 10:43 AM Boyd Kerbs wrote: Reason for CRM:  pt wants to come in the morning of Aug. 6th for labs. Need order and appt  >> Jan 22, 2018 10:46 AM Boyd Kerbs wrote: He would like testosterone checked and see if can add to labs and may need PA on this first.

## 2018-01-22 NOTE — Telephone Encounter (Signed)
Pt scheduled for cpe on 02/17/2017- he is requesting labs and testosterone check. Please advise.

## 2018-01-22 NOTE — Telephone Encounter (Signed)
Please call Pt- he is requesting labs prior to cpe- orders in- however please let him know that PCP recommends having them drawn 2-3 days prior to visit that way they will be back at time of his visit. Thank you.

## 2018-01-22 NOTE — Telephone Encounter (Signed)
Okay to proceed with labs before CPX, I would suggest to do it 2- 3 days ahead of time to be sure they are available at the time of the visit. CMP, CBC, TSH, FLP, A1c, free and total testosterone DX CPX

## 2018-01-22 NOTE — Telephone Encounter (Signed)
Pt is scheduled for 02/12/18 @ 7:30 for labs.

## 2018-02-12 ENCOUNTER — Other Ambulatory Visit (INDEPENDENT_AMBULATORY_CARE_PROVIDER_SITE_OTHER): Payer: Managed Care, Other (non HMO)

## 2018-02-12 DIAGNOSIS — Z0000 Encounter for general adult medical examination without abnormal findings: Secondary | ICD-10-CM | POA: Diagnosis not present

## 2018-02-12 LAB — CBC WITH DIFFERENTIAL/PLATELET
BASOS ABS: 0 10*3/uL (ref 0.0–0.1)
Basophils Relative: 0.6 % (ref 0.0–3.0)
EOS PCT: 1.1 % (ref 0.0–5.0)
Eosinophils Absolute: 0.1 10*3/uL (ref 0.0–0.7)
HEMATOCRIT: 40.5 % (ref 39.0–52.0)
HEMOGLOBIN: 13.9 g/dL (ref 13.0–17.0)
Lymphocytes Relative: 23.5 % (ref 12.0–46.0)
Lymphs Abs: 1.3 10*3/uL (ref 0.7–4.0)
MCHC: 34.3 g/dL (ref 30.0–36.0)
MCV: 92.2 fl (ref 78.0–100.0)
MONOS PCT: 8.5 % (ref 3.0–12.0)
Monocytes Absolute: 0.5 10*3/uL (ref 0.1–1.0)
Neutro Abs: 3.6 10*3/uL (ref 1.4–7.7)
Neutrophils Relative %: 66.3 % (ref 43.0–77.0)
Platelets: 172 10*3/uL (ref 150.0–400.0)
RBC: 4.39 Mil/uL (ref 4.22–5.81)
RDW: 13 % (ref 11.5–15.5)
WBC: 5.4 10*3/uL (ref 4.0–10.5)

## 2018-02-12 LAB — LIPID PANEL
CHOL/HDL RATIO: 2
CHOLESTEROL: 181 mg/dL (ref 0–200)
HDL: 79.6 mg/dL (ref >39.00)
LDL CALC: 93 mg/dL (ref 0–99)
NonHDL: 100.95
TRIGLYCERIDES: 39 mg/dL (ref 0.0–149.0)
VLDL: 7.8 mg/dL (ref 0.0–40.0)

## 2018-02-12 LAB — COMPREHENSIVE METABOLIC PANEL
ALBUMIN: 4.6 g/dL (ref 3.5–5.2)
ALT: 21 U/L (ref 0–53)
AST: 21 U/L (ref 0–37)
Alkaline Phosphatase: 62 U/L (ref 39–117)
BUN: 19 mg/dL (ref 6–23)
CALCIUM: 9.2 mg/dL (ref 8.4–10.5)
CHLORIDE: 105 meq/L (ref 96–112)
CO2: 29 mEq/L (ref 19–32)
CREATININE: 0.94 mg/dL (ref 0.40–1.50)
GFR: 91.55 mL/min (ref >60.00)
Glucose, Bld: 121 mg/dL — ABNORMAL HIGH (ref 70–99)
Potassium: 4.4 mEq/L (ref 3.5–5.1)
Sodium: 140 mEq/L (ref 135–145)
Total Bilirubin: 0.5 mg/dL (ref 0.2–1.2)
Total Protein: 6.6 g/dL (ref 6.0–8.3)

## 2018-02-12 LAB — TSH: TSH: 2.47 u[IU]/mL (ref 0.35–4.50)

## 2018-02-12 LAB — HEMOGLOBIN A1C: Hgb A1c MFr Bld: 5.4 % (ref 4.6–6.5)

## 2018-02-13 LAB — TESTOSTERONE TOTAL,FREE,BIO, MALES
ALBUMIN MSPROF: 4.6 g/dL (ref 3.6–5.1)
Sex Hormone Binding: 58 nmol/L — ABNORMAL HIGH (ref 10–50)
TESTOSTERONE: 578 ng/dL (ref 250–827)
Testosterone, Bioavailable: 100.8 ng/dL — ABNORMAL LOW (ref >=110.0)
Testosterone, Free: 48 pg/mL (ref 46.0–224.0)

## 2018-02-17 ENCOUNTER — Encounter: Payer: Self-pay | Admitting: Internal Medicine

## 2018-02-17 ENCOUNTER — Ambulatory Visit (INDEPENDENT_AMBULATORY_CARE_PROVIDER_SITE_OTHER): Payer: Managed Care, Other (non HMO) | Admitting: Internal Medicine

## 2018-02-17 VITALS — BP 132/66 | HR 65 | Temp 97.5°F | Resp 14 | Ht 73.0 in | Wt 193.5 lb

## 2018-02-17 DIAGNOSIS — Z0000 Encounter for general adult medical examination without abnormal findings: Secondary | ICD-10-CM

## 2018-02-17 MED ORDER — SILDENAFIL CITRATE 20 MG PO TABS: 60.0000 mg | ORAL_TABLET | Freq: Every evening | ORAL | 5 refills | Status: DC | PRN

## 2018-02-17 NOTE — Progress Notes (Signed)
Subjective:    Patient ID: Troy Leonard, male    DOB: September 02, 1970, 47 y.o.   MRN: [id number]  DOS:  02/17/2018 Type of visit - description : cpx Interval history: Has few concerns Back pain on and off for years, occasional flares up, mostly at the lower back bilaterally sometimes at the upper back. Occasionally feels a mild discomfort in both legs associated with back pain.  His energy is sometimes decreased, he wakes 5 in the morning most days and by 1 PM he typically needs a nap. Admits to mild snoring +  stress at work, libido sometimes is decreased, erection quality also decreased sometimes. Despite all that, he remains active, jogging, etc.  Review of Systems  Other than above, a 14 point review of systems is negative      Past Medical History:  Diagnosis Date  . Abnormal EKG    LVH, iRBB, sees cards   . Anxiety    sees Dr Caprice Beaver  . Hyperlipidemia   . Hypertension   . Multiple pigmented nevi    saw derm before     Past Surgical History:  Procedure Laterality Date  . FINGER SURGERY    . VASECTOMY  2004    Social History   Socioeconomic History  . Marital status: Married    Spouse name: Not on file  . Number of children: 2  . Years of education: Not on file  . Highest education level: Not on file  Occupational History  . Occupation: works for a Environmental consultant: [employer]  Social Needs  . Financial resource strain: Not on file  . Food insecurity:    Worry: Not on file    Inability: Not on file  . Transportation needs:    Medical: Not on file    Non-medical: Not on file  Tobacco Use  . Smoking status: Never Smoker  . Smokeless tobacco: Never Used  Substance and Sexual Activity  . Alcohol use: Yes    Alcohol/week: 12.6 oz    Types: 21 Glasses of wine per week    Comment: socially   . Drug use: No  . Sexual activity: Not on file  Lifestyle  . Physical activity:    Days per week: Not on file    Minutes per session: Not on file  .  Stress: Not on file  Relationships  . Social connections:    Talks on phone: Not on file    Gets together: Not on file    Attends religious service: Not on file    Active member of club or organization: Not on file    Attends meetings of clubs or organizations: Not on file    Relationship status: Not on file  . Intimate partner violence:    Fear of current or ex partner: Not on file    Emotionally abused: Not on file    Physically abused: Not on file    Forced sexual activity: Not on file  Other Topics Concern  . Not on file  Social History Narrative   Household-- pt wife, 2 children, 84-15 y/o     Family History  Problem Relation Age of Onset  . Colon polyps Father        patient believes father may have had around age 37  . Heart disease Mother        35s  . Breast cancer Mother        breast  . Mitral valve prolapse Mother  s/p heart transplant after MI 3-14  . Colon cancer Other        grandmother and grandfather?  . Cancer - Prostate Neg Hx   . Diabetes Neg Hx      Allergies as of 02/17/2018   No Known Allergies     Medication List        Accurate as of 02/17/18  6:25 PM. Always use your most recent med list.          arginine 500 MG tablet Take 500 mg by mouth daily.   atorvastatin 10 MG tablet Commonly known as:  LIPITOR Take 1 tablet (10 mg total) by mouth daily.   clonazePAM 2 MG tablet Commonly known as:  KLONOPIN Take 2 mg by mouth 3 (three) times daily as needed.   CO Q 10 PO Take 1 Dose by mouth daily.   multivitamin tablet Take 1 tablet by mouth daily.   psyllium 58.6 % powder Commonly known as:  METAMUCIL 1 teaspoon once daily   sildenafil 20 MG tablet Commonly known as:  REVATIO Take 3-4 tablets (60-80 mg total) by mouth at bedtime as needed.   TURMERIC CURCUMIN PO Take 1 capsule by mouth daily.          Objective:   Physical Exam BP 132/66 (BP Location: Left Arm, Patient Position: Sitting, Cuff Size: Normal)    Pulse 65   Temp (!) 97.5 F (36.4 C) (Oral)   Resp 14   Ht 6\' 1"  (1.854 m)   Wt 193 lb 8 oz (87.8 kg)   SpO2 98%   BMI 25.53 kg/m  General: Well developed, NAD, see BMI.  Neck: No  thyromegaly  HEENT:  Normocephalic . Face symmetric, atraumatic Lungs:  CTA B Normal respiratory effort, no intercostal retractions, no accessory muscle use. Heart: RRR,  no murmur.  No pretibial edema bilaterally  Abdomen:  Not distended, soft, non-tender. No rebound or rigidity.   Skin: Exposed areas without rash. Not pale. Not jaundice Neurologic:  alert & oriented X3.  Speech normal, gait appropriate for age and unassisted Strength symmetric and appropriate for age.  Psych: Cognition and judgment appear intact.  Cooperative with normal attention span and concentration.  Behavior appropriate. No anxious or depressed appearing.     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 Hoover Browns (clonazepam) Multiple nevus, sees derm  PLAN: HTN?  Ambulatory BPs are syndrome checked by normal.  BP today is normal. Hyperlipidemia: On atorvastatin, last FLP satisfactory Anxiety: On clonazepam, sees psychiatry, trying to manage stress and thinks he is doing okay for the most part. Fatigue: Occasional lack of energy, probably multifactorial including stress.  Epworth scale is 4 (-) Mild ED, decreased libido: Recent testosterone within normal, wonders about treatment, we agreed to trial sildenafil, side effects discussed, prescription printed. RTC 1 year, sooner if needed.

## 2018-02-17 NOTE — Assessment & Plan Note (Signed)
HTN?  Ambulatory BPs are syndrome checked by normal.  BP today is normal. Hyperlipidemia: On atorvastatin, last FLP satisfactory Anxiety: On clonazepam, sees psychiatry, trying to manage stress and thinks he is doing okay for the most part. Fatigue: Occasional lack of energy, probably multifactorial including stress.  Epworth scale is 4 (-) Mild ED, decreased libido: Recent testosterone within normal, wonders about treatment, we agreed to trial sildenafil, side effects discussed, prescription printed. RTC 1 year, sooner if needed.

## 2018-02-17 NOTE — Assessment & Plan Note (Signed)
--  Td-- 2012  --CCS:  Saw GI, rec Cscope at age 47 --DRE and PSA within normal 08-2015 --Doing great with diet and exercise  Labs : Reviewed with the patient.  Free testosterone is in the lower side of normal. RTC one year

## 2018-02-17 NOTE — Patient Instructions (Signed)
   GO TO THE FRONT DESK Schedule your next appointment for a physical exam in 1 year.  Sooner if needed.

## 2018-02-17 NOTE — Progress Notes (Signed)
Pre visit review using our clinic review tool, if applicable. No additional management support is needed unless otherwise documented below in the visit note. 

## 2018-02-22 ENCOUNTER — Encounter

## 2018-05-26 ENCOUNTER — Telehealth: Payer: Self-pay | Admitting: Internal Medicine

## 2018-05-26 DIAGNOSIS — M549 Dorsalgia, unspecified: Principal | ICD-10-CM

## 2018-05-26 DIAGNOSIS — G8929 Other chronic pain: Secondary | ICD-10-CM

## 2018-05-26 NOTE — Telephone Encounter (Signed)
Okay to place referral

## 2018-05-26 NOTE — Telephone Encounter (Signed)
Copied from [place] [id number]. Topic: Referral - Request for Referral >> May 26, 2018  5:19 PM Margot Ables wrote: Has patient seen PCP for this complaint? Yes - OV 02/17/18 *If NO, is insurance requiring patient see PCP for this issue before PCP can refer them? Referral for which specialty: referral to orthopedic or spine specialist Preferred provider/office: pt would prefer [place] area Reason for referral: Back pain on and off for years, occasional flares up, mostly at the lower back bilaterally sometimes at the upper back, pain radiating into left leg down to the knee, x 1 month

## 2018-05-27 NOTE — Telephone Encounter (Signed)
Referral placed.

## 2018-05-27 NOTE — Telephone Encounter (Signed)
Ok referral

## 2018-06-07 ENCOUNTER — Other Ambulatory Visit: Payer: Self-pay | Admitting: Internal Medicine

## 2018-06-08 ENCOUNTER — Ambulatory Visit (INDEPENDENT_AMBULATORY_CARE_PROVIDER_SITE_OTHER): Payer: Managed Care, Other (non HMO) | Admitting: Family Medicine

## 2018-06-08 ENCOUNTER — Encounter (INDEPENDENT_AMBULATORY_CARE_PROVIDER_SITE_OTHER): Payer: Self-pay | Admitting: Family Medicine

## 2018-06-08 ENCOUNTER — Ambulatory Visit (INDEPENDENT_AMBULATORY_CARE_PROVIDER_SITE_OTHER): Payer: Managed Care, Other (non HMO)

## 2018-06-08 DIAGNOSIS — M5442 Lumbago with sciatica, left side: Secondary | ICD-10-CM

## 2018-06-08 DIAGNOSIS — G8929 Other chronic pain: Secondary | ICD-10-CM | POA: Diagnosis not present

## 2018-06-08 DIAGNOSIS — M5441 Lumbago with sciatica, right side: Secondary | ICD-10-CM

## 2018-06-08 MED ORDER — ETODOLAC 400 MG PO TABS: 400.0000 mg | ORAL_TABLET | Freq: Two times a day (BID) | ORAL | 3 refills | Status: DC | PRN

## 2018-06-08 MED ORDER — TIZANIDINE HCL 2 MG PO TABS: 2.0000 mg | ORAL_TABLET | Freq: Four times a day (QID) | ORAL | 1 refills | Status: DC | PRN

## 2018-06-08 NOTE — Progress Notes (Signed)
Office Visit Note   Patient: Troy Leonard           Date of Birth: May 19, 1971           MRN: [id number] Visit Date: 06/08/2018 Requested by: Colon Branch, [address] [place], [place] [address] PCP: Colon Branch, MD  Subjective: Chief Complaint  Patient presents with  . Lower Back - Pain    Pain x years and progressively worsening - flares up 3-4 x year and lasting 4-6 weeks. Pain bilaterally and occasionally down bilateral anterior thighs to the knees. ? Started after helping someone with moving ( lifting of furniture).    HPI: Is a 47 year old with low back pain.  Symptoms started a few years ago.  He does not recall a specific injury, but when he frequently travels, he sometimes has to throw luggage awkwardly.  He thinks this might have been the cause of his symptoms.  At first he was having a few flareups per year, but now he is having pain almost constantly.  Occasional pain into the legs but mostly the pain is in his lower back.  If he takes it easy around the house and uses good posture, the pain improves after a few days.  He does not take medication on a regular basis for this.  No bowel or bladder dysfunction, no weakness or numbness in his legs.  No strong family history of back problems.  He does have a history of hypertension which is well controlled.  He has been on Lipitor for about 10 years for elevated total cholesterol.  In the past few years he has had low sugars in prediabetes range but is not treated for that.  He maintains a regular exercise regimen and he eats very healthfully even on the road.  He does not smoke cigarettes.               ROS: Otherwise noncontributory.  Objective: Vital Signs: There were no vitals taken for this visit.  Physical Exam:  Low back: Leg lengths are equal, no visible rash.  Good flexibility.  Tender near the SI joints and also in the lower lumbar spine along the spinous processes.  No distinct trigger points.  No pain  in the sciatic notch.  Good range of motion of the hips with no significant pain.  Negative straight leg raise, lower extremity strength and reflexes are normal.  Imaging: Lumbar x-rays: Anatomic alignment, no congenital deformities.  He has narrowing of the disc space at L4-5 but no spondylolisthesis.  Slight sclerosis of the SI joints.  Hip joints are well-preserved.  No sign of neoplasm or compression deformity.  Assessment & Plan: 1.  Chronic low back pain, possibly related to L4-5 degenerative disc disease.  Neurologic exam is nonfocal. -We will try physical therapy followed by home exercises for maintenance.  Lodine for inflammation and Zanaflex for muscle spasm. -If he fails to improve, consider MRI scan lumbar spine.  I will plan on seeing him back as needed.   Follow-Up Instructions: No follow-ups on file.      Procedures: No procedures performed  No notes on file    PMFS History: Patient Active Problem List   Diagnosis Date Noted  . Throat pain 04/03/2017  . PCP NOTES >>>>>>>>>>>>>>>>>>>>>>>>>>>>>. 09/07/2015  . Dyslipidemia 10/08/2012  . Annual physical exam 06/21/2011  . Epididymal cyst 05/20/2011  . RBBB 06/15/2009  . ABNORMAL ECHOCARDIOGRAM 06/15/2009  . Benign neoplasm of skin  06/05/2009  . ANXIETY 05/19/2008  . Essential hypertension 05/19/2008  . Backache 04/06/2008   Past Medical History:  Diagnosis Date  . Abnormal EKG    LVH, iRBB, sees cards   . Anxiety    sees Dr Caprice Beaver  . Hyperlipidemia   . Hypertension   . Multiple pigmented nevi    saw derm before     Family History  Problem Relation Age of Onset  . Colon polyps Father        patient believes father may have had around age 85  . Heart disease Mother        59s  . Breast cancer Mother        breast  . Mitral valve prolapse Mother        s/p heart transplant after MI 3-14  . Colon cancer Other        grandmother and grandfather?  . Cancer - Prostate Neg Hx   . Diabetes Neg Hx       Past Surgical History:  Procedure Laterality Date  . FINGER SURGERY    . VASECTOMY  2004   Social History   Occupational History  . Occupation: works for a Environmental consultant: [employer]  Tobacco Use  . Smoking status: Never Smoker  . Smokeless tobacco: Never Used  Substance and Sexual Activity  . Alcohol use: Yes    Alcohol/week: 21.0 standard drinks    Types: 21 Glasses of wine per week    Comment: socially   . Drug use: No  . Sexual activity: Not on file

## 2018-06-10 ENCOUNTER — Telehealth: Payer: Self-pay

## 2018-06-10 DIAGNOSIS — R739 Hyperglycemia, unspecified: Secondary | ICD-10-CM

## 2018-06-10 NOTE — Telephone Encounter (Signed)
Copied from [place] [id number]. Topic: General - Other >> Jun 10, 2018  7:57 AM Keene Breath wrote: Reason for CRM: Patient called to request that the doctor call him to discuss his elevated A1C that was noticed by another doctor.  There was a suggestion that Dr. Larose Kells check his CT Calcium score.  Please advise and call back at [phone].

## 2018-06-10 NOTE — Telephone Encounter (Signed)
LMOM informing Pt of office fax number- needs to send A1c- informed Pt that PCP prefers OV to discuss. Instructed him to call and schedule appt.

## 2018-06-10 NOTE — Telephone Encounter (Signed)
(  A1c here few months ago was 5.4). Get a BMP, A1c, DX hyperglycemia

## 2018-06-10 NOTE — Telephone Encounter (Signed)
Pt. called, wanting to know what to do regarding his elevated glucose from 02/2018 labwork as alerted by orthopedist during his visit for back pain. Per orthopedist, atorvastatin can contribute to back pain and can elevate BG, so orthopedist advised pt. to follow-up with Dr. Larose Kells on what to do regarding atorvastatin. OV made for 12/9 at 10AM with Dr. Larose Kells. Pt. Stated he will come early for fasting labs, lab appointment for 7AM made. Routed to Dr. Larose Kells to advise on what labs he would like ordered prior to visit.

## 2018-06-10 NOTE — Telephone Encounter (Signed)
Advise pt, unable to call today, rec to fax me the A1C , will call next week although is better to come for a OV

## 2018-06-15 NOTE — Telephone Encounter (Signed)
Labs ordered per Dr. Larose Kells.

## 2018-06-18 ENCOUNTER — Encounter: Payer: Self-pay | Admitting: Internal Medicine

## 2018-06-18 ENCOUNTER — Other Ambulatory Visit: Payer: Managed Care, Other (non HMO)

## 2018-06-18 ENCOUNTER — Ambulatory Visit: Payer: Managed Care, Other (non HMO) | Admitting: Internal Medicine

## 2018-06-18 VITALS — BP 126/70 | HR 61 | Temp 98.1°F | Resp 16 | Ht 73.0 in | Wt 197.1 lb

## 2018-06-18 DIAGNOSIS — M549 Dorsalgia, unspecified: Secondary | ICD-10-CM | POA: Diagnosis not present

## 2018-06-18 DIAGNOSIS — G8929 Other chronic pain: Secondary | ICD-10-CM

## 2018-06-18 DIAGNOSIS — R739 Hyperglycemia, unspecified: Secondary | ICD-10-CM | POA: Diagnosis not present

## 2018-06-18 LAB — HEMOGLOBIN A1C: HEMOGLOBIN A1C: 5.4 % (ref 4.6–6.5)

## 2018-06-18 NOTE — Progress Notes (Signed)
Pre visit review using our clinic review tool, if applicable. No additional management support is needed unless otherwise documented below in the visit note. 

## 2018-06-18 NOTE — Patient Instructions (Signed)
  GO TO THE LAB : Get the blood work     

## 2018-06-18 NOTE — Assessment & Plan Note (Signed)
Hyperglycemia: Last CBG fasting was 121 however A1c was 5.4. The concept of A1c was discussed, I recommend him to continue his healthy lifestyle.  We will check another A1c today. Back pain: Recently seen by Ortho, back pain is improving, he is actually not taking any medication, plans to do physical therapy. Hyperlipidemia: Wonders about statins and hyperglycemia, that is possible but at this point I think pros  >> cons when it comes to statins in his particular case.  Back pain is unlikely to be related to statins.  We agreed to continue. RTC next year for CPX

## 2018-06-18 NOTE — Progress Notes (Signed)
Subjective:    Patient ID: Troy Leonard, male    DOB: [birth year], 47 y.o.   MRN: [id number]  DOS:  06/18/2018 Type of visit - description : acute  Here because hyperglycemia, his orthopedic surgeon review records from this office and felt that his sugar was elevated. He also has back pain, and he is actually improving.  Review of Systems  He remains active, running twice a week. Eats healthy.  Past Medical History:  Diagnosis Date  . Abnormal EKG    LVH, iRBB, sees cards   . Anxiety    sees Dr Caprice Beaver  . Hyperlipidemia   . Hypertension   . Multiple pigmented nevi    saw derm before     Past Surgical History:  Procedure Laterality Date  . FINGER SURGERY    . VASECTOMY  2004    Social History   Socioeconomic History  . Marital status: Married    Spouse name: Not on file  . Number of children: 2  . Years of education: Not on file  . Highest education level: Not on file  Occupational History  . Occupation: works for a Environmental consultant: [employer]  Social Needs  . Financial resource strain: Not on file  . Food insecurity:    Worry: Not on file    Inability: Not on file  . Transportation needs:    Medical: Not on file    Non-medical: Not on file  Tobacco Use  . Smoking status: Never Smoker  . Smokeless tobacco: Never Used  Substance and Sexual Activity  . Alcohol use: Yes    Alcohol/week: 21.0 standard drinks    Types: 21 Glasses of wine per week    Comment: socially   . Drug use: No  . Sexual activity: Not on file  Lifestyle  . Physical activity:    Days per week: Not on file    Minutes per session: Not on file  . Stress: Not on file  Relationships  . Social connections:    Talks on phone: Not on file    Gets together: Not on file    Attends religious service: Not on file    Active member of club or organization: Not on file    Attends meetings of clubs or organizations: Not on file    Relationship status: Not on file  . Intimate partner  violence:    Fear of current or ex partner: Not on file    Emotionally abused: Not on file    Physically abused: Not on file    Forced sexual activity: Not on file  Other Topics Concern  . Not on file  Social History Narrative   Household-- pt wife, 2 children, 17-15 y/o      Allergies as of 06/18/2018   No Known Allergies     Medication List        Accurate as of 06/18/18  7:45 PM. Always use your most recent med list.          arginine 500 MG tablet Take 500 mg by mouth daily.   atorvastatin 10 MG tablet Commonly known as:  LIPITOR Take 1 tablet (10 mg total) by mouth daily.   clonazePAM 2 MG tablet Commonly known as:  KLONOPIN Take 2 mg by mouth 3 (three) times daily as needed.   multivitamin tablet Take 1 tablet by mouth daily.   psyllium 58.6 % powder Commonly known as:  METAMUCIL 1 teaspoon once daily  tiZANidine 2 MG tablet Commonly known as:  ZANAFLEX Take 1-2 tablets (2-4 mg total) by mouth every 6 (six) hours as needed for muscle spasms.   TURMERIC CURCUMIN PO Take 1 capsule by mouth daily.           Objective:   Physical Exam BP 126/70 (BP Location: Left Arm, Patient Position: Sitting, Cuff Size: Small)   Pulse 61   Temp 98.1 F (36.7 C) (Oral)   Resp 16   Ht 6\' 1"  (1.854 m)   Wt 197 lb 2 oz (89.4 kg)   SpO2 97%   BMI 26.01 kg/m  General:   Well developed, NAD, BMI noted. HEENT:  Normocephalic . Face symmetric, atraumatic Skin: Not pale. Not jaundice Neurologic:  alert & oriented X3.  Speech normal, gait appropriate for age and unassisted Psych--  Cognition and judgment appear intact.  Cooperative with normal attention span and concentration.  Behavior appropriate. No anxious or depressed appearing.      Assessment & Plan:   Assessment HTN - on no meds as of 12-2015 Hyperlipidemia CV: ---RBBB, see cards ---Trivial Ao insufficiency  by echo, last echo 12-2014 stable --- (+) FH CAD , mother in her 23s Anxiety, sees Dr.  Hoover Browns (clonazepam) Multiple nevus, sees derm  PLAN: Hyperglycemia: Last CBG fasting was 121 however A1c was 5.4. The concept of A1c was discussed, I recommend him to continue his healthy lifestyle.  We will check another A1c today. Back pain: Recently seen by Ortho, back pain is improving, he is actually not taking any medication, plans to do physical therapy. Hyperlipidemia: Wonders about statins and hyperglycemia, that is possible but at this point I think pros  >> cons when it comes to statins in his particular case.  Back pain is unlikely to be related to statins.  We agreed to continue. RTC next year for CPX

## 2018-06-22 ENCOUNTER — Ambulatory Visit: Payer: Managed Care, Other (non HMO) | Admitting: Internal Medicine

## 2018-06-22 ENCOUNTER — Other Ambulatory Visit: Payer: Managed Care, Other (non HMO)

## 2018-08-07 ENCOUNTER — Encounter: Payer: Self-pay | Admitting: Cardiovascular Disease

## 2018-08-27 ENCOUNTER — Ambulatory Visit: Payer: Managed Care, Other (non HMO) | Admitting: Cardiovascular Disease

## 2018-10-13 ENCOUNTER — Telehealth: Payer: Self-pay

## 2018-10-13 NOTE — Telephone Encounter (Signed)
Called pt regarding 4/16 appt with [place] Pt has no acute cardiac symptoms at this time and feels it would be best to postpone   Advised pt to call back if any new symptoms present. Will route to cancel pool

## 2018-10-29 ENCOUNTER — Ambulatory Visit: Payer: Managed Care, Other (non HMO) | Admitting: Cardiovascular Disease

## 2018-11-30 ENCOUNTER — Other Ambulatory Visit: Payer: Self-pay | Admitting: Internal Medicine

## 2019-02-09 ENCOUNTER — Encounter: Payer: Self-pay | Admitting: Internal Medicine

## 2019-03-02 ENCOUNTER — Ambulatory Visit (INDEPENDENT_AMBULATORY_CARE_PROVIDER_SITE_OTHER): Payer: Managed Care, Other (non HMO) | Admitting: Internal Medicine

## 2019-03-02 ENCOUNTER — Other Ambulatory Visit: Payer: Self-pay

## 2019-03-02 ENCOUNTER — Encounter: Payer: Self-pay | Admitting: Internal Medicine

## 2019-03-02 VITALS — BP 113/68 | HR 54 | Temp 97.7°F | Resp 16 | Ht 73.0 in | Wt 194.2 lb

## 2019-03-02 DIAGNOSIS — E785 Hyperlipidemia, unspecified: Secondary | ICD-10-CM

## 2019-03-02 DIAGNOSIS — Z0000 Encounter for general adult medical examination without abnormal findings: Secondary | ICD-10-CM | POA: Diagnosis not present

## 2019-03-02 NOTE — Progress Notes (Signed)
Subjective:    Patient ID: Troy Leonard, male    DOB: 31-Oct-1970, 48 y.o.   MRN: [id number]  DOS:  03/02/2019 Type of visit - description: CPX In general feeling well. Samule Dry has changed his job, he used to travel and being active but now is mostly sedentary working in front of the computer. Fortunately he still runs almost daily. Some stress , again quarantine related    Review of Systems  Other than above, a 14 point review of systems is negative      Past Medical History:  Diagnosis Date  . Abnormal EKG    LVH, iRBB, sees cards   . Anxiety    sees Dr Caprice Beaver  . Hyperlipidemia   . Hypertension   . Multiple pigmented nevi    saw derm before     Past Surgical History:  Procedure Laterality Date  . FINGER SURGERY    . VASECTOMY  2004    Social History   Socioeconomic History  . Marital status: Married    Spouse name: Not on file  . Number of children: 2  . Years of education: Not on file  . Highest education level: Not on file  Occupational History  . Occupation: works for a Environmental consultant: [employer]  Social Needs  . Financial resource strain: Not on file  . Food insecurity    Worry: Not on file    Inability: Not on file  . Transportation needs    Medical: Not on file    Non-medical: Not on file  Tobacco Use  . Smoking status: Never Smoker  . Smokeless tobacco: Never Used  Substance and Sexual Activity  . Alcohol use: Yes    Alcohol/week: 21.0 standard drinks    Types: 21 Glasses of wine per week    Comment: socially   . Drug use: No  . Sexual activity: Not on file  Lifestyle  . Physical activity    Days per week: Not on file    Minutes per session: Not on file  . Stress: Not on file  Relationships  . Social Herbalist on phone: Not on file    Gets together: Not on file    Attends religious service: Not on file    Active member of club or organization: Not on file    Attends meetings of clubs or organizations: Not  on file    Relationship status: Not on file  . Intimate partner violence    Fear of current or ex partner: Not on file    Emotionally abused: Not on file    Physically abused: Not on file    Forced sexual activity: Not on file  Other Topics Concern  . Not on file  Social History Narrative   Household-- pt wife, 1 child    2 children, 57-15 y/o,      Family History  Problem Relation Age of Onset  . Colon polyps Father        patient believes father may have had around age 70  . Heart disease Mother        3s  . Breast cancer Mother        breast  . Mitral valve prolapse Mother        s/p heart transplant after MI 3-14  . Colon cancer Other        grandmother and grandfather?  . Cancer - Prostate Neg Hx   . Diabetes Neg  Hx      Allergies as of 03/02/2019   No Known Allergies     Medication List       Accurate as of March 02, 2019 11:59 PM. If you have any questions, ask your nurse or doctor.        arginine 500 MG tablet Take 500 mg by mouth daily.   atorvastatin 10 MG tablet Commonly known as: LIPITOR Take 1 tablet (10 mg total) by mouth daily.   clonazePAM 2 MG tablet Commonly known as: KLONOPIN Take 2 mg by mouth 3 (three) times daily as needed.   multivitamin tablet Take 1 tablet by mouth daily.   psyllium 58.6 % powder Commonly known as: METAMUCIL 1 teaspoon once daily   tiZANidine 2 MG tablet Commonly known as: ZANAFLEX Take 1-2 tablets (2-4 mg total) by mouth every 6 (six) hours as needed for muscle spasms.   TURMERIC CURCUMIN PO Take 1 capsule by mouth daily.           Objective:   Physical Exam BP 113/68 (BP Location: Left Arm, Patient Position: Sitting, Cuff Size: Small)   Pulse (!) 54   Temp 97.7 F (36.5 C) (Temporal)   Resp 16   Ht 6\' 1"  (1.854 m)   Wt 194 lb 4 oz (88.1 kg)   SpO2 99%   BMI 25.63 kg/m  General: Well developed, NAD, BMI noted Neck: No  thyromegaly  HEENT:  Normocephalic . Face symmetric,  atraumatic Lungs:  CTA B Normal respiratory effort, no intercostal retractions, no accessory muscle use. Heart: RRR,  no murmur.  No pretibial edema bilaterally  Abdomen:  Not distended, soft, non-tender. No rebound or rigidity.   Skin: Exposed areas without rash. Not pale. Not jaundice Neurologic:  alert & oriented X3.  Speech normal, gait appropriate for age and unassisted Strength symmetric and appropriate for age.  Psych: Cognition and judgment appear intact.  Cooperative with normal attention span and concentration.  Behavior appropriate. No anxious or depressed appearing.     Assessment      Assessment HTN - on no meds as of 12-2015 Hyperlipidemia CV: ---RBBB, see cards ---Trivial Ao insufficiency  by echo, last echo 12-2014 stable --- (+) FH CAD , mother in her 84s Anxiety, sees Dr. Hoover Browns (clonazepam) Multiple nevus, sees derm  PLAN: HTN: Lifestyle controlled Hyperlipidemia: On Lipitor, checking labs Cardiovascular: Asymptomatic, to see cardiology this year. Anxiety: More stressed lately,quarentine related.  Counseled.  Knows to reach out if  help is needed. Back pain: Currently no symptoms, has episodic problems. RTC 1 year

## 2019-03-02 NOTE — Patient Instructions (Signed)
GO TO THE LAB : Get the blood work     GO TO THE FRONT DESK Schedule your next appointment   For a physical in 1 year

## 2019-03-02 NOTE — Assessment & Plan Note (Addendum)
--  Td: 2012  - flu shot recommended --CCS:  Saw GI, rec Cscope at age 48 --DRE and PSA within normal 08-2015, recheck at age 6 --His job has become sedentary due to COVID-19 fortunately he remains active running daily  --Labs: CMP, FLP, CBC, A1c.  -Testosterone was in the lower side of normal, recheck next year?  Currently no symptoms

## 2019-03-02 NOTE — Progress Notes (Signed)
Pre visit review using our clinic review tool, if applicable. No additional management support is needed unless otherwise documented below in the visit note. 

## 2019-03-03 LAB — COMPREHENSIVE METABOLIC PANEL
ALT: 21 U/L (ref 0–53)
AST: 21 U/L (ref 0–37)
Albumin: 4.6 g/dL (ref 3.5–5.2)
Alkaline Phosphatase: 56 U/L (ref 39–117)
BUN: 16 mg/dL (ref 6–23)
CO2: 31 mEq/L (ref 19–32)
Calcium: 9.6 mg/dL (ref 8.4–10.5)
Chloride: 102 mEq/L (ref 96–112)
Creatinine, Ser: 1.04 mg/dL (ref 0.40–1.50)
GFR: 76.3 mL/min (ref >60.00)
Glucose, Bld: 90 mg/dL (ref 70–99)
Potassium: 3.9 mEq/L (ref 3.5–5.1)
Sodium: 141 mEq/L (ref 135–145)
Total Bilirubin: 0.4 mg/dL (ref 0.2–1.2)
Total Protein: 6.6 g/dL (ref 6.0–8.3)

## 2019-03-03 LAB — LIPID PANEL
Cholesterol: 208 mg/dL — ABNORMAL HIGH (ref 0–200)
HDL: 88.3 mg/dL (ref >39.00)
LDL Cholesterol: 106 mg/dL — ABNORMAL HIGH (ref 0–99)
NonHDL: 119.94
Total CHOL/HDL Ratio: 2
Triglycerides: 69 mg/dL (ref 0.0–149.0)
VLDL: 13.8 mg/dL (ref 0.0–40.0)

## 2019-03-03 LAB — HEMOGLOBIN A1C: Hgb A1c MFr Bld: 5.2 % (ref 4.6–6.5)

## 2019-03-03 NOTE — Assessment & Plan Note (Signed)
HTN: Lifestyle controlled Hyperlipidemia: On Lipitor, checking labs Cardiovascular: Asymptomatic, to see cardiology this year. Anxiety: More stressed lately,quarentine related.  Counseled.  Knows to reach out if  help is needed. Back pain: Currently no symptoms, has episodic problems. RTC 1 year

## 2019-03-12 ENCOUNTER — Ambulatory Visit (INDEPENDENT_AMBULATORY_CARE_PROVIDER_SITE_OTHER): Payer: Managed Care, Other (non HMO)

## 2019-03-12 ENCOUNTER — Other Ambulatory Visit: Payer: Self-pay

## 2019-03-12 DIAGNOSIS — Z23 Encounter for immunization: Secondary | ICD-10-CM | POA: Diagnosis not present

## 2019-08-01 ENCOUNTER — Telehealth: Payer: Self-pay | Admitting: Internal Medicine

## 2019-08-02 ENCOUNTER — Telehealth: Payer: Self-pay

## 2019-08-02 NOTE — Telephone Encounter (Signed)
Has taken it before, Rx sent

## 2019-08-02 NOTE — Telephone Encounter (Signed)
PA denied.   CaseId:[id number];Status:Denied;Review Type:Prior Auth;Appeal Information: [place] [id number]; Important - Please read the below note on eAppeals: Please reference the denial letter for information on the rights for an appeal, rationale for the denial, and how to submit an appeal including if any information is needed to support the appeal. Note about urgent situations - Generally, an urgent situation is one which, in the opinion of the provider, the health of the patient may be in serious jeopardy or may experience pain that cannot be adequately controlled while waiting for a decision on the appeal.;

## 2019-08-02 NOTE — Telephone Encounter (Signed)
PA initiated via Covermymeds; KEY: [id number]. Awaiting determination.

## 2019-08-02 NOTE — Telephone Encounter (Signed)
Pt requesting refill on sildenafil. No longer on med list. Please advise.

## 2019-08-25 ENCOUNTER — Encounter: Payer: Self-pay | Admitting: Internal Medicine

## 2019-08-25 DIAGNOSIS — R0683 Snoring: Secondary | ICD-10-CM

## 2019-08-25 DIAGNOSIS — Z0189 Encounter for other specified special examinations: Secondary | ICD-10-CM

## 2019-08-26 NOTE — Addendum Note (Signed)
Addended byDamita Dunnings D on: 08/26/2019 08:07 AM   Modules accepted: Orders

## 2019-09-17 ENCOUNTER — Ambulatory Visit: Payer: Managed Care, Other (non HMO) | Admitting: Cardiology

## 2019-10-12 ENCOUNTER — Telehealth (INDEPENDENT_AMBULATORY_CARE_PROVIDER_SITE_OTHER): Payer: Managed Care, Other (non HMO) | Admitting: Cardiology

## 2019-10-12 ENCOUNTER — Telehealth: Payer: Self-pay | Admitting: *Deleted

## 2019-10-12 ENCOUNTER — Other Ambulatory Visit: Payer: Self-pay

## 2019-10-12 ENCOUNTER — Encounter: Payer: Self-pay | Admitting: Cardiology

## 2019-10-12 VITALS — HR 71 | Ht 73.0 in | Wt 193.0 lb

## 2019-10-12 DIAGNOSIS — G4719 Other hypersomnia: Secondary | ICD-10-CM | POA: Diagnosis not present

## 2019-10-12 DIAGNOSIS — R0681 Apnea, not elsewhere classified: Secondary | ICD-10-CM

## 2019-10-12 DIAGNOSIS — R0683 Snoring: Secondary | ICD-10-CM | POA: Diagnosis not present

## 2019-10-12 NOTE — Progress Notes (Signed)
Virtual Visit via Telephone Note   This visit type was conducted due to national recommendations for restrictions regarding the COVID-19 Pandemic (e.g. social distancing) in an effort to limit this patient's exposure and mitigate transmission in our community.  Due to his co-morbid illnesses, this patient is at least at moderate risk for complications without adequate follow up.  This format is felt to be most appropriate for this patient at this time.  The patient did not have access to video technology/had technical difficulties with video requiring transitioning to audio format only (telephone).  All issues noted in this document were discussed and addressed.  No physical exam could be performed with this format.  Please refer to the patient's chart for his  consent to telehealth for [facility].  Evaluation Performed: Sleep Medicine Consult  This visit type was conducted due to national recommendations for restrictions regarding the COVID-19 Pandemic (e.g. social distancing).  This format is felt to be most appropriate for this patient at this time.  All issues noted in this document were discussed and addressed.  No physical exam was performed (except for noted visual exam findings with Video Visits).  Please refer to the patient's chart (MyChart message for video visits and phone note for telephone visits) for the patient's consent to telehealth for [facility].  Date:  10/12/2019   ID:  Troy Leonard, DOB 1971-06-16, MRN [id number]  Patient Location:  Home  Provider location:   [place]  PCP:  Colon Branch, MD  Cardiologist:  Lauree Chandler Sleep Medicine:  Marisa Cyphers, MD Electrophysiologist:  None   Chief Complaint:  OSA  History of Present Illness:    Troy Leonard is a 49 y.o. male who presents via audio/video conferencing for a telehealth visit today.    This is a 49yo male with a hx of HTN, HLD and RBBB.  He is referred in consultation from Dr. Larose Kells for  evaluation of possible OSA due to significant snoring.  He told his PCP that he was concerned that he might have sleep apnea.  He tells me that he has problems with snoring that he thinks is related to a deviated nasal septum.  Occasionally his wife will notice that he has witnessed episodes of apnea during sleep.  Occasionally he will wake up at night but not gasping for breath.    The patient does not have symptoms concerning for COVID-19 infection (fever, chills, cough, or new shortness of breath).   Prior CV studies:   The following studies were reviewed today:  none  Past Medical History:  Diagnosis Date  . ABNORMAL ECHOCARDIOGRAM 06/15/2009   Qualifier: Diagnosis of  By: Angelena Form, MD, [place]ave    . Abnormal EKG    LVH, iRBB, sees cards   . Anxiety    sees Dr Caprice Beaver  . ANXIETY 05/19/2008   Qualifier: Diagnosis of  By: Larose Kells MD, [place] 04/06/2008   Qualifier: Diagnosis of  By: Larose Kells MD, [place] Benign neoplasm of skin 06/05/2009   Qualifier: Diagnosis of  By: Larose Kells MD, [place] Dyslipidemia 10/08/2012  . Epididymal cyst 05/20/2011  . Essential hypertension 05/19/2008   Qualifier: Diagnosis of  By: Larose Kells MD, [place] Hyperlipidemia   . Hypertension   . Multiple pigmented nevi    saw derm before   . RBBB 06/15/2009   Qualifier: Diagnosis of  By: Earley Favor RN, BSN, [place]ie Man  Throat pain 04/03/2017   Past Surgical History:  Procedure Laterality Date  . FINGER SURGERY    . VASECTOMY  2004     Current Meds  Medication Sig  . arginine 500 MG tablet Take 500 mg by mouth daily.  Marland Kitchen atorvastatin (LIPITOR) 10 MG tablet Take 1 tablet (10 mg total) by mouth daily.  . clonazePAM (KLONOPIN) 2 MG tablet Take 2 mg by mouth 3 (three) times daily as needed.   . Multiple Vitamin (MULTIVITAMIN) tablet Take 1 tablet by mouth daily.    . psyllium (METAMUCIL) 58.6 % powder 1 teaspoon once daily  . sildenafil (REVATIO) 20 MG tablet TAKE 3-4 TABLETS BY MOUTH AT BEDTIME AS  NEEDED  . TURMERIC CURCUMIN PO Take 1 capsule by mouth daily.   . WELLBUTRIN XL 150 MG 24 hr tablet Take 150 mg by mouth every morning.     Allergies:   Patient has no known allergies.   Social History   Tobacco Use  . Smoking status: Never Smoker  . Smokeless tobacco: Never Used  Substance Use Topics  . Alcohol use: Yes    Alcohol/week: 21.0 standard drinks    Types: 21 Glasses of wine per week    Comment: socially   . Drug use: No     Family Hx: The patient's family history includes Atrial fibrillation in his father; Breast cancer in his mother; Colon cancer in an other family member; Colon polyps in his father; Heart disease in his mother; Mitral valve prolapse in his mother; Stroke in his father. There is no history of Cancer - Prostate or Diabetes.  ROS:   Please see the history of present illness.     All other systems reviewed and are negative.   Labs/Other Tests and Data Reviewed:    Recent Labs: 03/02/2019: ALT 21; BUN 16; Creatinine, Ser 1.04; Potassium 3.9; Sodium 141   Recent Lipid Panel Lab Results  Component Value Date/Time   CHOL 208 (H) 03/02/2019 02:56 PM   TRIG 69.0 03/02/2019 02:56 PM   HDL 88.30 03/02/2019 02:56 PM   CHOLHDL 2 03/02/2019 02:56 PM   LDLCALC 106 (H) 03/02/2019 02:56 PM   LDLDIRECT 171.4 10/08/2012 11:24 AM    Wt Readings from Last 3 Encounters:  10/12/19 193 lb (87.5 kg)  03/02/19 194 lb 4 oz (88.1 kg)  06/18/18 197 lb 2 oz (89.4 kg)     Objective:    Vital Signs:  Pulse 71   Ht 6\' 1"  (1.854 m)   Wt 193 lb (87.5 kg)   BMI 25.46 kg/m     ASSESSMENT & PLAN:    1.  Snoring -his wife has noticed that he has some witnessed episodes of apnea during sleep -he feels sleepy in the am and will nap during the day -he goes to bed at 10pm and gets up a 5am  -He does snore some but no am HAs -I have recommended getting a home sleep study to assess for OSA  2.  Excessive daytime sleepiness -he has problems with needing to nap  during the day and likely has a component of OSA -see #1 will get home sleep study  3.  HTN -he is currently not on any BP meds  COVID-19 Education: The signs and symptoms of COVID-19 were discussed with the patient and how to seek care for testing (follow up with PCP or arrange E-visit).  The importance of social distancing was discussed today.  Patient Risk:   After full review of  this patient's clinical status, I feel that they are at least moderate risk at this time.  Time:   Today, I have spent 20 minutes on telephone discussing medical problems including snoring , excessive daytime sleepiness and reviewing patient's chart including OV notes from PCP.  Medication Adjustments/Labs and Tests Ordered: Current medicines are reviewed at length with the patient today.  Concerns regarding medicines are outlined above.  Tests Ordered: No orders of the defined types were placed in this encounter.  Medication Changes: No orders of the defined types were placed in this encounter.   Disposition:  Follow up prn  Signed, Fransico Him, MD  10/12/2019 2:36 PM    [facility]

## 2019-10-12 NOTE — Telephone Encounter (Signed)
-----   Message from Michaelyn Barter, RN sent at 10/12/2019  3:16 PM EDT ----- Order placed for home sleep study.

## 2019-10-12 NOTE — Patient Instructions (Signed)
Medication Instructions:  *If you need a refill on your cardiac medications before your next appointment, please call your pharmacy*  Lab Work: If you have labs (blood work) drawn today and your tests are completely normal, you will receive your results only by: Marland Kitchen MyChart Message (if you have MyChart) OR . A paper copy in the mail If you have any lab test that is abnormal or we need to change your treatment, we will call you to review the results.  Testing/Procedures: Home Sleep Study   Follow-Up: At [facility], you and your health needs are our priority.  As part of our continuing mission to provide you with exceptional heart care, we have created designated Provider Care Teams.  These Care Teams include your primary Cardiologist (physician) and Advanced Practice Providers (APPs -  Physician Assistants and Nurse Practitioners) who all work together to provide you with the care you need, when you need it.  We recommend signing up for the patient portal called "MyChart".  Sign up information is provided on this After Visit Summary.  MyChart is used to connect with patients for Virtual Visits (Telemedicine).  Patients are able to view lab/test results, encounter notes, upcoming appointments, etc.  Non-urgent messages can be sent to your provider as well.   To learn more about what you can do with MyChart, go to [email or web address].    Your next appointment:   As Needed  The format for your next appointment:   Either In Person or Virtual  Provider:   You may see Dr. Radford Pax or one of the following Advanced Practice Providers on your designated Care Team:    Melina Copa, PA-C  Ermalinda Barrios, PA-C

## 2019-10-12 NOTE — Telephone Encounter (Signed)

## 2019-10-12 NOTE — Addendum Note (Signed)
Addended by: Aris Georgia, Akeisha Lagerquist L on: 10/12/2019 03:16 PM   Modules accepted: Orders

## 2019-10-13 ENCOUNTER — Telehealth: Payer: Self-pay | Admitting: *Deleted

## 2019-10-13 NOTE — Telephone Encounter (Signed)
PA faxed to [place] for HST.

## 2019-10-14 ENCOUNTER — Telehealth: Payer: Self-pay | Admitting: *Deleted

## 2019-10-14 NOTE — Telephone Encounter (Signed)
-----   Message from Michaelyn Barter, RN sent at 10/12/2019  3:16 PM EDT ----- Order placed for home sleep study.

## 2019-10-14 NOTE — Telephone Encounter (Signed)
Staff message sent to [place]. Ok to schedule a HST. Approval received from [place]. (They actually sent 2 approvals.) Auth # 1 [id number] and [id number]. Both have valid dates of 10/13/19 to 01/11/20.

## 2019-10-22 NOTE — Telephone Encounter (Signed)
  Lauralee Evener, CMA  Freada Bergeron, CMA  Ok to schedule. Received Auth from [place]. Auth #[id number] Valid dates 10/13/19 to 01/11/20.

## 2019-10-22 NOTE — Telephone Encounter (Signed)
Patient is aware and agreeable to Home Sleep Study through [facility]. Patient is scheduled for 12/30/19 at 10 am to pick up home sleep kit and meet with Respiratory therapist at [facility]. Patient is aware that if this appointment date and time does not work for them they should contact Artis Delay directly at [phone]. Patient is aware that a sleep packet will be sent from [facility] in week. Patient is agreeable to treatment and thankful for call.

## 2019-11-01 ENCOUNTER — Encounter: Payer: Self-pay | Admitting: Internal Medicine

## 2019-11-22 ENCOUNTER — Other Ambulatory Visit: Payer: Self-pay | Admitting: Internal Medicine

## 2019-12-30 ENCOUNTER — Ambulatory Visit (HOSPITAL_BASED_OUTPATIENT_CLINIC_OR_DEPARTMENT_OTHER): Payer: Managed Care, Other (non HMO) | Attending: Cardiology | Admitting: Cardiology

## 2019-12-30 ENCOUNTER — Other Ambulatory Visit: Payer: Self-pay

## 2019-12-30 DIAGNOSIS — G4733 Obstructive sleep apnea (adult) (pediatric): Secondary | ICD-10-CM | POA: Diagnosis not present

## 2019-12-30 DIAGNOSIS — R0902 Hypoxemia: Secondary | ICD-10-CM | POA: Insufficient documentation

## 2019-12-30 DIAGNOSIS — G4734 Idiopathic sleep related nonobstructive alveolar hypoventilation: Secondary | ICD-10-CM

## 2019-12-30 DIAGNOSIS — G4719 Other hypersomnia: Secondary | ICD-10-CM | POA: Diagnosis present

## 2019-12-30 DIAGNOSIS — R0681 Apnea, not elsewhere classified: Secondary | ICD-10-CM

## 2019-12-30 DIAGNOSIS — R0683 Snoring: Secondary | ICD-10-CM

## 2020-01-01 NOTE — Procedures (Signed)
    Patient Name: Seanpaul, Preece Date: 12/30/2019 Gender: Male D.O.B: 1971-03-04 Age (years): 1 Referring Provider: Fransico Him MD, ABSM Height (inches): 70 Interpreting Physician: Fransico Him MD, ABSM Weight (lbs): 195 RPSGT: Jonna Coup BMI: 28 MRN: [id number] Neck Size: 16.50  CLINICAL INFORMATION Sleep Study Type: HST  Indication for sleep study: N/A  Epworth Sleepiness Score: 3  SLEEP STUDY TECHNIQUE A multi-channel overnight portable sleep study was performed. The channels recorded were: nasal airflow, thoracic respiratory movement, and oxygen saturation with a pulse oximetry. Snoring was also monitored.  MEDICATIONS Patient self administered medications include: N/A.  SLEEP ARCHITECTURE Patient was studied for 440 minutes. The sleep efficiency was 100.0 % and the patient was supine for 0%. The arousal index was 0.0 per hour.  RESPIRATORY PARAMETERS The overall AHI was 7.6 per hour, with a central apnea index of 0.0 per hour.  The oxygen nadir was 76% during sleep.  CARDIAC DATA Mean heart rate during sleep was 52.0 bpm.  IMPRESSIONS - Mild obstructive sleep apnea occurred during this study (AHI = 7.6/h). - No significant central sleep apnea occurred during this study (CAI = 0.0/h). - Severe oxygen desaturation was noted during this study (Min O2 = 76%). - Patient snored 1.9% during the sleep.  DIAGNOSIS - Obstructive Sleep Apnea (327.23 [G47.33 ICD-10]) - Nocturnal Hypoxemia (327.26 [G47.36 ICD-10])  RECOMMENDATIONS - Therapeutic CPAP titration to determine optimal pressure required to alleviate sleep disordered breathing. - Avoid alcohol, sedatives and other CNS depressants that may worsen sleep apnea and disrupt normal sleep architecture. - Sleep hygiene should be reviewed to assess factors that may improve sleep quality. - Weight management and regular exercise should be initiated or continued. - Patient may benefit from in-lab  study  [Electronically signed] 01/01/2020 05:51 PM  Fransico Him MD, ABSM Diplomate, American Board of Sleep Medicine

## 2020-01-06 ENCOUNTER — Telehealth: Payer: Self-pay | Admitting: *Deleted

## 2020-01-06 DIAGNOSIS — G4719 Other hypersomnia: Secondary | ICD-10-CM

## 2020-01-06 NOTE — Telephone Encounter (Signed)
-----   Message from Sueanne Margarita, MD sent at 01/01/2020  5:54 PM EDT ----- Please let patient know that they have sleep apnea and recommend CPAP titration. Please set up titration in the sleep lab.

## 2020-01-06 NOTE — Telephone Encounter (Signed)
Called results lmtcb. 

## 2020-01-12 NOTE — Telephone Encounter (Addendum)
Informed patient of sleep study results and patient understanding was verbalized. Patient understands his sleep study showed they have sleep apnea and recommend CPAP titration. Please set up titration in the sleep lab.  Pt is aware and agreeable to his results.  Titration sent to sleep pool   

## 2020-01-14 ENCOUNTER — Telehealth: Payer: Self-pay | Admitting: *Deleted

## 2020-01-14 NOTE — Telephone Encounter (Signed)
CPAP titration PA request sent to [facility] via fax @ [phone].

## 2020-01-18 ENCOUNTER — Telehealth: Payer: Self-pay | Admitting: *Deleted

## 2020-01-18 NOTE — Telephone Encounter (Signed)
Staff message sent to [place] Leonard denied CPAP titration. Recommends APAP. Notify ordering provider.

## 2020-01-19 NOTE — Telephone Encounter (Signed)
Informed from pre-cert dept that patients [place] insurance denied CPAP titration. Recommendation are APAP titration study. Will notify ordering provider for further recommedations.

## 2020-01-24 NOTE — Telephone Encounter (Signed)
APAP ORDERS: Resmed CPAP on auto from 4 to 18cm H2O with heated humidity and mask of choice. Followup 8 weeks     Upon patient request DME selection is [place] Patient understands he will be contacted by [facility] to set up his cpap. Patient understands to call if [facility] does not contact him with new setup in a timely manner. Patient understands they will be called once confirmation has been received from [employer] that they have received their new machine to schedule 10 week follow up appointment.  [place] notified of new cpap order  Please add to airview Patient was grateful for the call and thanked me.

## 2020-02-28 NOTE — Telephone Encounter (Signed)
Patient called today to say he postponed getting his APAP due to changing insurance at the beginning of August. He is now ready and he contacted his dme to get set up. [employer] needed his Rx and that has been sent via fax to [phone].

## 2020-02-28 NOTE — Addendum Note (Signed)
Addended by: Freada Bergeron on: 02/28/2020 09:37 AM   Modules accepted: Orders

## 2020-03-02 DIAGNOSIS — F411 Generalized anxiety disorder: Secondary | ICD-10-CM | POA: Diagnosis not present

## 2020-03-04 ENCOUNTER — Other Ambulatory Visit: Payer: Self-pay | Admitting: Internal Medicine

## 2020-04-03 DIAGNOSIS — G4733 Obstructive sleep apnea (adult) (pediatric): Secondary | ICD-10-CM | POA: Diagnosis not present

## 2020-04-04 DIAGNOSIS — F411 Generalized anxiety disorder: Secondary | ICD-10-CM | POA: Diagnosis not present

## 2020-04-21 NOTE — Telephone Encounter (Signed)
Patient has a 10 week follow up appointment scheduled for .06/06/20. Patient understands he needs to keep this appointment for insurance compliance. Patient was grateful for the call and thanked me.

## 2020-04-23 ENCOUNTER — Other Ambulatory Visit: Payer: Self-pay | Admitting: Internal Medicine

## 2020-04-25 DIAGNOSIS — F411 Generalized anxiety disorder: Secondary | ICD-10-CM | POA: Diagnosis not present

## 2020-04-28 DIAGNOSIS — F411 Generalized anxiety disorder: Secondary | ICD-10-CM | POA: Diagnosis not present

## 2020-05-15 ENCOUNTER — Ambulatory Visit (HOSPITAL_COMMUNITY): Payer: BC Managed Care – PPO | Attending: Cardiovascular Disease

## 2020-05-15 ENCOUNTER — Ambulatory Visit (INDEPENDENT_AMBULATORY_CARE_PROVIDER_SITE_OTHER): Payer: BC Managed Care – PPO | Admitting: Cardiovascular Disease

## 2020-05-15 ENCOUNTER — Encounter: Payer: Self-pay | Admitting: Cardiovascular Disease

## 2020-05-15 ENCOUNTER — Other Ambulatory Visit: Payer: Self-pay

## 2020-05-15 VITALS — BP 132/80 | HR 55 | Ht 73.0 in | Wt 190.2 lb

## 2020-05-15 DIAGNOSIS — I351 Nonrheumatic aortic (valve) insufficiency: Secondary | ICD-10-CM | POA: Insufficient documentation

## 2020-05-15 LAB — ECHOCARDIOGRAM COMPLETE
Area-P 1/2: 2.31 cm2
Height: 73 in
P 1/2 time: 609 msec
S' Lateral: 4 cm
Weight: 3043.2 oz

## 2020-05-15 NOTE — Patient Instructions (Signed)
Medication Instructions:  Your provider recommends that you continue on your current medications as directed. Please refer to the Current Medication list given to you today.   *If you need a refill on your cardiac medications before your next appointment, please call your pharmacy*  Testing/Procedures: Your physician has requested that you have an echocardiogram. Echocardiography is a painless test that uses sound waves to create images of your heart. It provides your doctor with information about the size and shape of your heart and how well your hearts chambers and valves are working. This procedure takes approximately one hour. There are no restrictions for this procedure.  Follow-Up: At [facility], you and your health needs are our priority.  As part of our continuing mission to provide you with exceptional heart care, we have created designated Provider Care Teams.  These Care Teams include your primary Cardiologist (physician) and Advanced Practice Providers (APPs -  Physician Assistants and Nurse Practitioners) who all work together to provide you with the care you need, when you need it. Your next appointment:   2 year(s) The format for your next appointment:   In Person Provider:   You may see Dr. Angelena Form or one of the following Advanced Practice Providers on your designated Care Team:    Melina Copa, PA-C  Ermalinda Barrios, PA-C

## 2020-05-15 NOTE — Progress Notes (Signed)
Chief Complaint  Patient presents with  . Follow-up    aortic insufficiency   History of Present Illness: 49 yo male with a history of anxiety, sleep apnea, hyperlipidemia, aortic insufficency and HTN who is here today for cardiac follow up. He was initially seen in our office November 2009 for an abnormal EKG. At that time he denied having any chest pain, but did complain of occasional shortness of breath when he was having episodes of anxiety. Echo was overall normal with mild AI. Most recent echo June 2016 with normal LV systolic function, trivial AI.   He is here today for follow up. The patient denies any chest pain, dyspnea, palpitations, lower extremity edema, orthopnea, PND, dizziness, near syncope or syncope. He feels great. He is still running.    Primary Care Physician: Colon Branch, MD   Past Medical History:  Diagnosis Date  . ABNORMAL ECHOCARDIOGRAM 06/15/2009   Qualifier: Diagnosis of  By: Angelena Form, MD, Harrell Gave    . Abnormal EKG    LVH, iRBB, sees cards   . Anxiety    sees Dr Caprice Beaver  . ANXIETY 05/19/2008   Qualifier: Diagnosis of  By: Larose Kells MD, [place] 04/06/2008   Qualifier: Diagnosis of  By: Larose Kells MD, [place] Benign neoplasm of skin 06/05/2009   Qualifier: Diagnosis of  By: Larose Kells MD, [place] Dyslipidemia 10/08/2012  . Epididymal cyst 05/20/2011  . Essential hypertension 05/19/2008   Qualifier: Diagnosis of  By: Larose Kells MD, [place] Hyperlipidemia   . Hypertension   . Multiple pigmented nevi    saw derm before   . RBBB 06/15/2009   Qualifier: Diagnosis of  By: Earley Favor RN, BSN, Leonie Man Throat pain 04/03/2017    Past Surgical History:  Procedure Laterality Date  . FINGER SURGERY    . VASECTOMY  2004    Current Outpatient Medications  Medication Sig Dispense Refill  . arginine 500 MG tablet Take 500 mg by mouth daily.    Marland Kitchen atorvastatin (LIPITOR) 10 MG tablet Take 1 tablet (10 mg total) by mouth daily. 30 tablet 0  . clonazePAM  (KLONOPIN) 2 MG tablet Take 2 mg by mouth 3 (three) times daily as needed.     . Multiple Vitamin (MULTIVITAMIN) tablet Take 1 tablet by mouth daily.      . psyllium (METAMUCIL) 58.6 % powder 1 teaspoon once daily    . sildenafil (REVATIO) 20 MG tablet TAKE 3-4 TABLETS BY MOUTH AT BEDTIME AS NEEDED 30 tablet 2  . WELLBUTRIN XL 150 MG 24 hr tablet Take 150 mg by mouth every morning.     No current facility-administered medications for this visit.    No Known Allergies  Social History   Socioeconomic History  . Marital status: Married    Spouse name: Not on file  . Number of children: 2  . Years of education: Not on file  . Highest education level: Not on file  Occupational History  . Occupation: works for a Environmental consultant: [employer]  Tobacco Use  . Smoking status: Never Smoker  . Smokeless tobacco: Never Used  Vaping Use  . Vaping Use: Never used  Substance and Sexual Activity  . Alcohol use: Yes    Alcohol/week: 21.0 standard drinks    Types: 21 Glasses of wine per week    Comment: socially   . Drug use: No  .  Sexual activity: Not on file  Other Topics Concern  . Not on file  Social History Narrative   Household-- pt wife, 1 child    2 children, 73-15 y/o,    Social Determinants of Health   Financial Resource Strain:   . Difficulty of Paying Living Expenses: Not on file  Food Insecurity:   . Worried About Charity fundraiser in the Last Year: Not on file  . Ran Out of Food in the Last Year: Not on file  Transportation Needs:   . Lack of Transportation (Medical): Not on file  . Lack of Transportation (Non-Medical): Not on file  Physical Activity:   . Days of Exercise per Week: Not on file  . Minutes of Exercise per Session: Not on file  Stress:   . Feeling of Stress : Not on file  Social Connections:   . Frequency of Communication with Friends and Family: Not on file  . Frequency of Social Gatherings with Friends and Family: Not on file  . Attends  Religious Services: Not on file  . Active Member of Clubs or Organizations: Not on file  . Attends Archivist Meetings: Not on file  . Marital Status: Not on file  Intimate Partner Violence:   . Fear of Current or Ex-Partner: Not on file  . Emotionally Abused: Not on file  . Physically Abused: Not on file  . Sexually Abused: Not on file    Family History  Problem Relation Age of Onset  . Colon polyps Father        patient believes father may have had around age 67  . Stroke Father   . Atrial fibrillation Father   . Heart disease Mother        44s  . Breast cancer Mother        breast  . Mitral valve prolapse Mother        s/p heart transplant after MI 3-14  . Colon cancer Other        grandmother and grandfather?  . Cancer - Prostate Neg Hx   . Diabetes Neg Hx     Review of Systems:  As stated in the HPI and otherwise negative.   BP 132/80   Pulse (!) 55   Ht 6\' 1"  (1.854 m)   Wt 190 lb 3.2 oz (86.3 kg)   SpO2 98%   BMI 25.09 kg/m   Physical Examination: General: Well developed, well nourished, NAD  HEENT: OP clear, mucus membranes moist  SKIN: warm, dry. No rashes. Neuro: No focal deficits  Musculoskeletal: Muscle strength 5/5 all ext  Psychiatric: Mood and affect normal  Neck: No JVD, no carotid bruits, no thyromegaly, no lymphadenopathy.  Lungs:Clear bilaterally, no wheezes, rhonci, crackles Cardiovascular: Regular rate and rhythm. No murmurs, gallops or rubs. Abdomen:Soft. Bowel sounds present. Non-tender.  Extremities: No lower extremity edema. Pulses are 2 + in the bilateral DP/PT.  Echo June 2016: Left ventricle: The cavity size was normal. Wall thickness was   normal. Systolic function was normal. The estimated ejection   fraction was in the range of 50% to 55%. Wall motion was normal;   there were no regional wall motion abnormalities. - Aortic valve: There was trivial regurgitation. - Left atrium: The atrium was mildly dilated. - Right  atrium: The atrium was mildly dilated.  EKG:  EKG is ordered today. The ekg ordered today demonstrates sinus brady, rate 55 bpm  Recent Labs: No results found for requested labs within last  8760 hours.   Lipid Panel    Component Value Date/Time   CHOL 208 (H) 03/02/2019 1456   TRIG 69.0 03/02/2019 1456   HDL 88.30 03/02/2019 1456   CHOLHDL 2 03/02/2019 1456   VLDL 13.8 03/02/2019 1456   LDLCALC 106 (H) 03/02/2019 1456   LDLDIRECT 171.4 10/08/2012 1124     Wt Readings from Last 3 Encounters:  05/15/20 190 lb 3.2 oz (86.3 kg)  12/30/19 195 lb (88.5 kg)  10/12/19 193 lb (87.5 kg)     Other studies Reviewed: Additional studies/ records that were reviewed today include: . Review of the above records demonstrates:   Assessment and Plan:   1. Incomplete RBBB: Chronic.   2. HYPERTENSION: BP is well controlled. Conitinue current therapy  3. Aortic insufficiency: Trivial to mild by echo 2016.  Repeat echo now.   Current medicines are reviewed at length with the patient today.  The patient does not have concerns regarding medicines.  The following changes have been made:  no change  Labs/ tests ordered today include:   Orders Placed This Encounter  Procedures  . EKG 12-Lead  . ECHOCARDIOGRAM COMPLETE   Disposition:   FU with me in 2 years  Signed, Lauree Chandler, MD 05/15/2020 9:03 AM    [facility] [place], [place], [place]  [address] Phone: [phone]; Fax: [phone]

## 2020-05-17 ENCOUNTER — Telehealth: Payer: Self-pay | Admitting: Cardiovascular Disease

## 2020-05-17 NOTE — Telephone Encounter (Signed)
Patient is returning call to discuss echo results. °

## 2020-05-17 NOTE — Telephone Encounter (Signed)
Pt has been notified of echo results by phone with verbal understanding. Pt thanked me for the call today. The patient has been notified of the result and verbalized understanding.  All questions (if any) were answered. Julaine Hua, [place] 05/17/2020 10:04 AM

## 2020-05-17 NOTE — Telephone Encounter (Signed)
Patient is returning call.  °

## 2020-05-17 NOTE — Telephone Encounter (Signed)
Returned pt's call to go over echo results. Left message to call back.

## 2020-05-25 DIAGNOSIS — F411 Generalized anxiety disorder: Secondary | ICD-10-CM | POA: Diagnosis not present

## 2020-05-30 ENCOUNTER — Telehealth: Payer: Self-pay | Admitting: Cardiology

## 2020-05-30 NOTE — Telephone Encounter (Signed)
Troy Leonard from [facility] called in and would like to know if Dr turner approves for pt ok to take Vayvane 40Mg  daily   Best number [phone]

## 2020-05-30 NOTE — Telephone Encounter (Signed)
Do they  mean Vyvanse?

## 2020-05-30 NOTE — Telephone Encounter (Signed)
No problem from sleep standpoint but need to check with his primary cardiologist

## 2020-05-31 NOTE — Telephone Encounter (Signed)
Left message to call back  

## 2020-05-31 NOTE — Telephone Encounter (Signed)
OK from my standpoint. Troy Leonard

## 2020-06-01 ENCOUNTER — Other Ambulatory Visit: Payer: Self-pay | Admitting: Internal Medicine

## 2020-06-01 NOTE — Telephone Encounter (Signed)
Spoke with Lonn Georgia from [facility] and advised her that per Dr. Radford Pax and Dr. Angelena Form it is okay for the patient to take vyvanse.

## 2020-06-03 DIAGNOSIS — G4733 Obstructive sleep apnea (adult) (pediatric): Secondary | ICD-10-CM | POA: Diagnosis not present

## 2020-06-06 ENCOUNTER — Encounter: Payer: Self-pay | Admitting: Cardiology

## 2020-06-06 ENCOUNTER — Telehealth (INDEPENDENT_AMBULATORY_CARE_PROVIDER_SITE_OTHER): Payer: BC Managed Care – PPO | Admitting: Cardiology

## 2020-06-06 ENCOUNTER — Other Ambulatory Visit: Payer: Self-pay

## 2020-06-06 VITALS — HR 91 | Ht 73.0 in | Wt 190.0 lb

## 2020-06-06 DIAGNOSIS — I10 Essential (primary) hypertension: Secondary | ICD-10-CM

## 2020-06-06 DIAGNOSIS — G4733 Obstructive sleep apnea (adult) (pediatric): Secondary | ICD-10-CM

## 2020-06-06 NOTE — Progress Notes (Signed)
Virtual Visit via Telephone Note   This visit type was conducted due to national recommendations for restrictions regarding the COVID-19 Pandemic (e.g. social distancing) in an effort to limit this patient's exposure and mitigate transmission in our community.  Due to his co-morbid illnesses, this patient is at least at moderate risk for complications without adequate follow up.  This format is felt to be most appropriate for this patient at this time.  The patient did not have access to video technology/had technical difficulties with video requiring transitioning to audio format only (telephone).  All issues noted in this document were discussed and addressed.  No physical exam could be performed with this format.  Please refer to the patient's chart for his  consent to telehealth for [facility].  Evaluation Performed: Followup  This visit type was conducted due to national recommendations for restrictions regarding the COVID-19 Pandemic (e.g. social distancing).  This format is felt to be most appropriate for this patient at this time.  All issues noted in this document were discussed and addressed.  No physical exam was performed (except for noted visual exam findings with Video Visits).  Please refer to the patient's chart (MyChart message for video visits and phone note for telephone visits) for the patient's consent to telehealth for [facility].  Date:  06/06/2020   ID:  Troy Leonard, DOB [birth year], MRN [id number]  Patient Location:  Home  Provider location:   [place]  PCP:  Colon Branch, MD  Cardiologist:  Lauree Chandler Sleep Medicine:   Fransico Him, MD Electrophysiologist:  None   Chief Complaint:  OSA  History of Present Illness:    Troy Leonard is a 49 y.o. male who presents via audio/video conferencing for a telehealth visit today.    This is a 49yo male with a hx of HTN, HLD and RBBB.  He is referred in consultation from Dr. Larose Kells for evaluation of  possible OSA due to significant snoring.  He told his PCP that he was concerned that he might have sleep apnea.  He thought his problems with snoring was related to a deviated nasal septum.  Occasionally his wife would notice that he would have episodes of apnea during sleep.  Occasionally he would wake up at night but not gasping for breath.  At his last OV we decided to proceed with home sleep study.  This showed an AHI of 7.6/hr with severe O2 desaturations to 76%.  He was set up of auto CPAP and is now here for followup.   He is doing well with his CPAP device and thinks that he has gotten used to it.  He tolerates the full face mask and feels the pressure is adequate.  Since going on CPAP he feels rested in the am and has no significant daytime sleepiness.  He has some mouth dryness at times.   According to his wife he is not snoring any.    The patient does not have symptoms concerning for COVID-19 infection (fever, chills, cough, or new shortness of breath).   Prior CV studies:   The following studies were reviewed today:  Home sleep study and PAP compliance download  Past Medical History:  Diagnosis Date  . ABNORMAL ECHOCARDIOGRAM 06/15/2009   Qualifier: Diagnosis of  By: Angelena Form, MD, Harrell Gave    . Abnormal EKG    LVH, iRBB, sees cards   . Anxiety    sees Dr Caprice Beaver  . ANXIETY 05/19/2008   Qualifier: Diagnosis of  ByLarose Kells MD, [place] 04/06/2008   Qualifier: Diagnosis of  By: Larose Kells MD, [place] Benign neoplasm of skin 06/05/2009   Qualifier: Diagnosis of  By: Larose Kells MD, [place] Dyslipidemia 10/08/2012  . Epididymal cyst 05/20/2011  . Essential hypertension 05/19/2008   Qualifier: Diagnosis of  By: Larose Kells MD, [place] Hyperlipidemia   . Hypertension   . Multiple pigmented nevi    saw derm before   . RBBB 06/15/2009   Qualifier: Diagnosis of  By: Earley Favor RN, BSN, Leonie Man Throat pain 04/03/2017   Past Surgical History:  Procedure Laterality Date  .  FINGER SURGERY    . VASECTOMY  2004     Current Meds  Medication Sig  . arginine 500 MG tablet Take 500 mg by mouth daily.  Marland Kitchen atorvastatin (LIPITOR) 10 MG tablet Take 1 tablet (10 mg total) by mouth daily.  . clonazePAM (KLONOPIN) 2 MG tablet Take 2 mg by mouth 3 (three) times daily as needed.   . Lisdexamfetamine Dimesylate (VYVANSE PO) Take by mouth daily.  . Multiple Vitamin (MULTIVITAMIN) tablet Take 1 tablet by mouth daily.    . psyllium (METAMUCIL) 58.6 % powder 1 teaspoon once daily  . sildenafil (REVATIO) 20 MG tablet TAKE 3-4 TABLETS BY MOUTH AT BEDTIME AS NEEDED  . WELLBUTRIN XL 150 MG 24 hr tablet Take 150 mg by mouth every morning.     Allergies:   Patient has no known allergies.   Social History   Tobacco Use  . Smoking status: Never Smoker  . Smokeless tobacco: Never Used  Vaping Use  . Vaping Use: Never used  Substance Use Topics  . Alcohol use: Yes    Alcohol/week: 21.0 standard drinks    Types: 21 Glasses of wine per week    Comment: socially   . Drug use: No     Family Hx: The patient's family history includes Atrial fibrillation in his father; Breast cancer in his mother; Colon cancer in an other family member; Colon polyps in his father; Heart disease in his mother; Mitral valve prolapse in his mother; Stroke in his father. There is no history of Cancer - Prostate or Diabetes.  ROS:   Please see the history of present illness.     All other systems reviewed and are negative.   Labs/Other Tests and Data Reviewed:    Recent Labs: No results found for requested labs within last 8760 hours.   Recent Lipid Panel Lab Results  Component Value Date/Time   CHOL 208 (H) 03/02/2019 02:56 PM   TRIG 69.0 03/02/2019 02:56 PM   HDL 88.30 03/02/2019 02:56 PM   CHOLHDL 2 03/02/2019 02:56 PM   LDLCALC 106 (H) 03/02/2019 02:56 PM   LDLDIRECT 171.4 10/08/2012 11:24 AM    Wt Readings from Last 3 Encounters:  06/06/20 190 lb (86.2 kg)  05/15/20 190 lb 3.2 oz  (86.3 kg)  12/30/19 195 lb (88.5 kg)     Objective:    Vital Signs:  Pulse 91   Ht 6\' 1"  (1.854 m)   Wt 190 lb (86.2 kg)   BMI 25.07 kg/m     ASSESSMENT & PLAN:    1.  OSA - The pathophysiology of obstructive sleep apnea , it's cardiovascular consequences & modes of treatment including CPAP were discused with the patient in detail & they evidenced understanding.  The patient is tolerating PAP therapy well without  any problems. The PAP download was reviewed today and showed an AHI of 0.6/hr on auto PAP with 100% compliance in using more than 4 hours nightly.  The patient has been using and benefiting from PAP use and will continue to benefit from therapy.  -I will get an overnight pulse ox on CPAP to make sure the nocturnal hypoxemia has resolved on CPAP -he is getting some condensation in his tubing and mask>>encouraged him to try to adjust the humidity downward  2.  Excessive daytime sleepiness -he does not have any need to nap although enjoys a nap once and a while -continue on CPAP  3.  HTN -he is currently not on any BP meds  COVID-19 Education: He is vaccinated fully  Patient Risk:   After full review of this patient's clinical status, I feel that they are at least moderate risk at this time.  Time:   Today, I have spent 20 minutes on telephone discussing medical problems including snoring , excessive daytime sleepiness and reviewing patient's chart including home sleep study and PAP compliance download.   Medication Adjustments/Labs and Tests Ordered: Current medicines are reviewed at length with the patient today.  Concerns regarding medicines are outlined above.  Tests Ordered: No orders of the defined types were placed in this encounter.  Medication Changes: No orders of the defined types were placed in this encounter.   Disposition:  Follow up in 1 year in office  Signed, Fransico Him, MD  06/06/2020 9:30 AM    [place]

## 2020-06-06 NOTE — Patient Instructions (Signed)

## 2020-06-13 ENCOUNTER — Other Ambulatory Visit: Payer: Self-pay

## 2020-06-13 ENCOUNTER — Ambulatory Visit (INDEPENDENT_AMBULATORY_CARE_PROVIDER_SITE_OTHER): Payer: BC Managed Care – PPO | Admitting: Internal Medicine

## 2020-06-13 ENCOUNTER — Encounter: Payer: Self-pay | Admitting: Internal Medicine

## 2020-06-13 VITALS — BP 129/86 | HR 79 | Temp 97.6°F | Ht 73.0 in | Wt 188.6 lb

## 2020-06-13 DIAGNOSIS — M545 Low back pain, unspecified: Secondary | ICD-10-CM | POA: Diagnosis not present

## 2020-06-13 MED ORDER — PREDNISONE 10 MG PO TABS: ORAL_TABLET | ORAL | 0 refills | Status: DC

## 2020-06-13 MED ORDER — CYCLOBENZAPRINE HCL 10 MG PO TABS: 10.0000 mg | ORAL_TABLET | Freq: Two times a day (BID) | ORAL | 0 refills | Status: DC | PRN

## 2020-06-13 NOTE — Progress Notes (Signed)
Subjective:    Patient ID: Troy Leonard, male    DOB: 1970/09/27, 49 y.o.   MRN: [id number]  DOS:  06/13/2020 Type of visit - description: Acute  The patient did some heavy lifting at home 4 days ago. The next day he did some lifting again and then developed back pain. Pain is at the low back, radiates anteriorly to both sides (thighs). Much worse when he tries to get up from bed or change positions. Today's is slightly better but by no means gone.  Denies any fever chills or a rash No bladder or bowel incontinence No unilateral motor deficits. Ibuprofen helps to some extent  Review of Systems See above   Past Medical History:  Diagnosis Date  . ABNORMAL ECHOCARDIOGRAM 06/15/2009   Qualifier: Diagnosis of  By: Angelena Form, MD, Harrell Gave    . Abnormal EKG    LVH, iRBB, sees cards   . Anxiety    sees Dr Caprice Beaver  . ANXIETY 05/19/2008   Qualifier: Diagnosis of  By: Larose Kells MD, [place] 04/06/2008   Qualifier: Diagnosis of  By: Larose Kells MD, [place] Benign neoplasm of skin 06/05/2009   Qualifier: Diagnosis of  By: Larose Kells MD, [place] Dyslipidemia 10/08/2012  . Epididymal cyst 05/20/2011  . Essential hypertension 05/19/2008   Qualifier: Diagnosis of  By: Larose Kells MD, [place] Hyperlipidemia   . Hypertension   . Multiple pigmented nevi    saw derm before   . RBBB 06/15/2009   Qualifier: Diagnosis of  By: Earley Favor RN, BSN, Leonie Man Throat pain 04/03/2017    Past Surgical History:  Procedure Laterality Date  . FINGER SURGERY    . VASECTOMY  2004    Allergies as of 06/13/2020   No Known Allergies     Medication List       Accurate as of June 13, 2020 11:59 PM. If you have any questions, ask your nurse or doctor.        arginine 500 MG tablet Take 500 mg by mouth daily.   atorvastatin 10 MG tablet Commonly known as: LIPITOR Take 1 tablet (10 mg total) by mouth daily.   clonazePAM 2 MG tablet Commonly known as: KLONOPIN Take 2 mg by mouth 3  (three) times daily as needed.   cyclobenzaprine 10 MG tablet Commonly known as: FLEXERIL Take 1 tablet (10 mg total) by mouth 2 (two) times daily as needed for muscle spasms. Started by: Kathlene November, MD   multivitamin tablet Take 1 tablet by mouth daily.   predniSONE 10 MG tablet Commonly known as: DELTASONE 4 tablets x 2 days, 3 tabs x 2 days, 2 tabs x 2 days, 1 tab x 2 days Started by: Kathlene November, MD   psyllium 58.6 % powder Commonly known as: METAMUCIL 1 teaspoon once daily   sildenafil 20 MG tablet Commonly known as: REVATIO TAKE 3-4 TABLETS BY MOUTH AT BEDTIME AS NEEDED   VYVANSE PO Take by mouth daily.   Wellbutrin XL 150 MG 24 hr tablet Generic drug: buPROPion Take 150 mg by mouth every morning.          Objective:   Physical Exam BP 129/86 (BP Location: Left Arm, Patient Position: Sitting, Cuff Size: Large)   Pulse 79   Temp 97.6 F (36.4 C) (Oral)   Ht 6\' 1"  (1.854 m)   Wt 188 lb 9.6 oz (85.5 kg)   SpO2 100%  BMI 24.88 kg/m  General:   Well developed, NAD, BMI noted.  HEENT:  Normocephalic . Face symmetric, atraumatic Abdomen:  Not distended, soft, non-tender. No rebound or rigidity.   Skin: Not pale. Not jaundice Lower extremities: no pretibial edema bilaterally  Neurologic:  alert & oriented X3.  Speech normal, gait and posture  antalgic Motor and DTR symmetric Straight leg test: Increased back pain but no radiation. Psych--  Cognition and judgment appear intact.  Cooperative with normal attention span and concentration.  Behavior appropriate. No anxious or depressed appearing.     Assessment      Assessment HTN - on no meds as of 12-2015 Hyperlipidemia CV: ---RBBB, see cards ---Trivial Ao insufficiency  by echo, last echo 12-2014 stable --- (+) FH CAD , mother in her 87s Anxiety, sees Dr. Hoover [place] (clonazepam) Multiple nevus, sees derm  PLAN: Acute lumbalgia: With no red flag symptoms, he has a history of previously lumbalgias but  this seems to be severe, no history of back surgery. Plan: Prednisone, Tylenol, ibuprofen.  Also Flexeril as needed, watch for drowsiness. Use compress either cold or hot. Call if not better.  In the long-term physical therapy will be helpful, he is aware.  See AVS for details. Recommend CPX at his convenience        This visit occurred during the SARS-CoV-2 public health emergency.  Safety protocols were in place, including screening questions prior to the visit, additional usage of staff PPE, and extensive cleaning of exam room while observing appropriate contact time as indicated for disinfecting solutions.

## 2020-06-13 NOTE — Patient Instructions (Addendum)
Take prednisone as prescribed for few days  For pain: Tylenol  500 mg OTC 2 tabs a day every 8 hours as needed for pain  Once you finish the prednisone you can start another anti-inflammatory such as ibuprofen  IBUPROFEN (Advil or Motrin) 200 mg 2 tablets every 8 hours as needed for pain.  Always take it with food because may cause gastritis and ulcers.  If you notice nausea, stomach pain, change in the color of stools --->  Stop the medicine and let us know  Okay to alternate Tylenol and ibuprofen  Flexeril is a muscle relaxant, you can take it twice a day, watch for excessive somnolence, most people take it at night only.  Call if no better in the next 10 days  Call when you are ready to do physical therapy  Schedule a physical exam at your convenience

## 2020-06-14 NOTE — Assessment & Plan Note (Signed)
Acute lumbalgia: With no red flag symptoms, he has a history of previously lumbalgias but this seems to be severe, no history of back surgery. Plan: Prednisone, Tylenol, ibuprofen.  Also Flexeril as needed, watch for drowsiness. Use compress either cold or hot. Call if not better.  In the long-term physical therapy will be helpful, he is aware.  See AVS for details. Recommend CPX at his convenience

## 2020-06-16 ENCOUNTER — Telehealth: Payer: Self-pay | Admitting: *Deleted

## 2020-06-16 ENCOUNTER — Other Ambulatory Visit: Payer: Self-pay | Admitting: Internal Medicine

## 2020-06-16 DIAGNOSIS — F411 Generalized anxiety disorder: Secondary | ICD-10-CM | POA: Diagnosis not present

## 2020-06-16 DIAGNOSIS — G4733 Obstructive sleep apnea (adult) (pediatric): Secondary | ICD-10-CM

## 2020-06-16 NOTE — Telephone Encounter (Signed)
Patient notified his doctor has ordered him a overnight pulse ox on CPAP to make sure the nocturnal hypoxemia has resolved on CPAP. Left detailed message on voicemail and informed patient to call back with questions.

## 2020-06-16 NOTE — Telephone Encounter (Signed)
-----   Message from Sueanne Margarita, MD sent at 06/06/2020  9:35 AM EST ----- get an overnight pulse ox on CPAP to make sure the nocturnal hypoxemia has resolved on CPAP

## 2020-06-20 ENCOUNTER — Encounter: Payer: Self-pay | Admitting: Internal Medicine

## 2020-06-20 DIAGNOSIS — M549 Dorsalgia, unspecified: Secondary | ICD-10-CM

## 2020-06-26 ENCOUNTER — Other Ambulatory Visit: Payer: Self-pay | Admitting: Internal Medicine

## 2020-06-26 ENCOUNTER — Other Ambulatory Visit: Payer: Self-pay

## 2020-07-03 DIAGNOSIS — G4733 Obstructive sleep apnea (adult) (pediatric): Secondary | ICD-10-CM | POA: Diagnosis not present

## 2020-07-06 DIAGNOSIS — F900 Attention-deficit hyperactivity disorder, predominantly inattentive type: Secondary | ICD-10-CM | POA: Diagnosis not present

## 2020-07-06 DIAGNOSIS — F411 Generalized anxiety disorder: Secondary | ICD-10-CM | POA: Diagnosis not present

## 2020-07-18 DIAGNOSIS — F900 Attention-deficit hyperactivity disorder, predominantly inattentive type: Secondary | ICD-10-CM | POA: Diagnosis not present

## 2020-07-18 DIAGNOSIS — F411 Generalized anxiety disorder: Secondary | ICD-10-CM | POA: Diagnosis not present

## 2020-08-03 DIAGNOSIS — F411 Generalized anxiety disorder: Secondary | ICD-10-CM | POA: Diagnosis not present

## 2020-08-03 DIAGNOSIS — G4733 Obstructive sleep apnea (adult) (pediatric): Secondary | ICD-10-CM | POA: Diagnosis not present

## 2020-08-03 DIAGNOSIS — F900 Attention-deficit hyperactivity disorder, predominantly inattentive type: Secondary | ICD-10-CM | POA: Diagnosis not present

## 2020-08-05 ENCOUNTER — Other Ambulatory Visit: Payer: Self-pay | Admitting: Internal Medicine

## 2020-08-14 DIAGNOSIS — F411 Generalized anxiety disorder: Secondary | ICD-10-CM | POA: Diagnosis not present

## 2020-08-14 DIAGNOSIS — F900 Attention-deficit hyperactivity disorder, predominantly inattentive type: Secondary | ICD-10-CM | POA: Diagnosis not present

## 2020-08-21 ENCOUNTER — Telehealth: Payer: Self-pay | Admitting: *Deleted

## 2020-08-21 DIAGNOSIS — G4733 Obstructive sleep apnea (adult) (pediatric): Secondary | ICD-10-CM

## 2020-08-21 NOTE — Telephone Encounter (Signed)
Order placed to [facility]

## 2020-08-25 ENCOUNTER — Encounter: Payer: BC Managed Care – PPO | Admitting: Internal Medicine

## 2020-09-03 DIAGNOSIS — G4733 Obstructive sleep apnea (adult) (pediatric): Secondary | ICD-10-CM | POA: Diagnosis not present

## 2020-09-22 DIAGNOSIS — F411 Generalized anxiety disorder: Secondary | ICD-10-CM | POA: Diagnosis not present

## 2020-09-22 DIAGNOSIS — F900 Attention-deficit hyperactivity disorder, predominantly inattentive type: Secondary | ICD-10-CM | POA: Diagnosis not present

## 2020-09-29 ENCOUNTER — Ambulatory Visit (INDEPENDENT_AMBULATORY_CARE_PROVIDER_SITE_OTHER): Payer: BC Managed Care – PPO | Admitting: Internal Medicine

## 2020-09-29 ENCOUNTER — Encounter: Payer: Self-pay | Admitting: Internal Medicine

## 2020-09-29 ENCOUNTER — Other Ambulatory Visit: Payer: Self-pay

## 2020-09-29 VITALS — BP 118/80 | HR 79 | Temp 97.7°F | Ht 73.0 in | Wt 192.0 lb

## 2020-09-29 DIAGNOSIS — R5383 Other fatigue: Secondary | ICD-10-CM

## 2020-09-29 DIAGNOSIS — Z1211 Encounter for screening for malignant neoplasm of colon: Secondary | ICD-10-CM

## 2020-09-29 DIAGNOSIS — Z0000 Encounter for general adult medical examination without abnormal findings: Secondary | ICD-10-CM | POA: Diagnosis not present

## 2020-09-29 DIAGNOSIS — Z23 Encounter for immunization: Secondary | ICD-10-CM

## 2020-09-29 NOTE — Patient Instructions (Addendum)
We are referring you to gastroenterology, Please call and set the visit for a colonoscopy: [place], [place] back for blood work, next week, fasting  Come back for a physical exam in 1 year

## 2020-09-29 NOTE — Progress Notes (Signed)
Subjective:    Patient ID: Troy Leonard, male    DOB: 09/16/1970, 50 y.o.   MRN: [id number]  DOS:  09/29/2020 Type of visit - description: Here for CPX Labs CPX more than a year ago. Saw  cardiology, diagnosed with sleep apnea, using a CPAP. In general feels well. From time to time she is still feels tired and would like his testosterone checked. Also started Vyvanse and that has definitely increase his focus   Review of Systems  Other than above, a 14 point review of systems is negative      Past Medical History:  Diagnosis Date  . ABNORMAL ECHOCARDIOGRAM 06/15/2009   Qualifier: Diagnosis of  By: Angelena Form, MD, Harrell Gave    . Abnormal EKG    LVH, iRBB, sees cards   . Anxiety    sees Dr Caprice Beaver  . ANXIETY 05/19/2008   Qualifier: Diagnosis of  By: Larose Kells MD, [place] 04/06/2008   Qualifier: Diagnosis of  By: Larose Kells MD, [place] Benign neoplasm of skin 06/05/2009   Qualifier: Diagnosis of  By: Larose Kells MD, [place] Dyslipidemia 10/08/2012  . Epididymal cyst 05/20/2011  . Essential hypertension 05/19/2008   Qualifier: Diagnosis of  By: Larose Kells MD, [place] Hyperlipidemia   . Hypertension   . Multiple pigmented nevi    saw derm before   . RBBB 06/15/2009   Qualifier: Diagnosis of  By: Earley Favor RN, BSN, Leonie Man Throat pain 04/03/2017    Past Surgical History:  Procedure Laterality Date  . FINGER SURGERY    . VASECTOMY  2004    Allergies as of 09/29/2020   No Known Allergies     Medication List       Accurate as of September 29, 2020 11:59 PM. If you have any questions, ask your nurse or doctor.        STOP taking these medications   cyclobenzaprine 10 MG tablet Commonly known as: FLEXERIL Stopped by: Kathlene November, MD   predniSONE 10 MG tablet Commonly known as: DELTASONE Stopped by: Kathlene November, MD     TAKE these medications   arginine 500 MG tablet Take 500 mg by mouth daily.   atorvastatin 10 MG tablet Commonly known as: LIPITOR Take 1 tablet  (10 mg total) by mouth daily.   clonazePAM 2 MG tablet Commonly known as: KLONOPIN Take 2 mg by mouth 3 (three) times daily as needed.   multivitamin tablet Take 1 tablet by mouth daily.   psyllium 58.6 % powder Commonly known as: METAMUCIL 1 teaspoon once daily   sildenafil 20 MG tablet Commonly known as: REVATIO TAKE 3-4 TABLETS BY MOUTH AT BEDTIME AS NEEDED   VYVANSE PO Take by mouth daily.   Wellbutrin XL 150 MG 24 hr tablet Generic drug: buPROPion Take 150 mg by mouth every morning.          Objective:   Physical Exam BP 118/80 (BP Location: Right Arm, Patient Position: Sitting, Cuff Size: Large)   Pulse 79   Temp 97.7 F (36.5 C) (Oral)   Ht 6\' 1"  (1.854 m)   Wt 192 lb (87.1 kg)   SpO2 98%   BMI 25.33 kg/m  General: Well developed, NAD, BMI noted Neck: No  thyromegaly  HEENT:  Normocephalic . Face symmetric, atraumatic Lungs:  CTA B Normal respiratory effort, no intercostal retractions, no accessory muscle use. Heart: RRR,  no murmur.  Abdomen:  Not distended, soft, non-tender. No rebound or rigidity.   GU: Normal testicles bilaterally, epididymis is prominent with a cyst noted proximally on the right. Lower extremities: no pretibial edema bilaterally  Skin: Exposed areas without rash. Not pale. Not jaundice Neurologic:  alert & oriented X3.  Speech normal, gait appropriate for age and unassisted Strength symmetric and appropriate for age.  Psych: Cognition and judgment appear intact.  Cooperative with normal attention span and concentration.  Behavior appropriate. No anxious or depressed appearing.     Assessment    Assessment HTN - on no meds as of 12-2015 Hyperlipidemia CV: ---RBBB, see cards ---Trivial Ao insufficiency  by echo, last echo 12-2014 stable --- (+) FH CAD , mother in her 57s OSA on CPAP dx 12/2019 Anxiety,ADD  sees Dr. Hoover Browns (clonazepam) Multiple nevus, sees derm  PLAN: Here for CPX HTN: On no meds, BP is very  good. Hyperlipidemia, on atorvastatin, checking labs OSA: Dx last year by cardiology, on CPAP, doing well Impaired fasting glucose: Check A1c ADD: Since the last visit started Vyvanse with good results. R epididymal cyst: Documented by [place] before, today's exam consistent with the DX, rec self testicular exam RTC 1 year CPX    This visit occurred during the SARS-CoV-2 public health emergency.  Safety protocols were in place, including screening questions prior to the visit, additional usage of staff PPE, and extensive cleaning of exam room while observing appropriate contact time as indicated for disinfecting solutions.

## 2020-09-30 ENCOUNTER — Encounter: Payer: Self-pay | Admitting: Internal Medicine

## 2020-09-30 NOTE — Assessment & Plan Note (Signed)
Here for CPX HTN: On no meds, BP is very good. Hyperlipidemia, on atorvastatin, checking labs OSA: Dx last year by cardiology, on CPAP, doing well Impaired fasting glucose: Check A1c ADD: Since the last visit started Vyvanse with good results. R epididymal cyst: Documented by [place] before, today's exam consistent with the DX, rec self testicular exam RTC 1 year CPX

## 2020-09-30 NOTE — Assessment & Plan Note (Signed)
--  Td: 2012, booster today -COVID VAX x3 -Had a flu shot --CCS: Interested in cscope,  refer to GI -DRE and PSA: at age 50 --His job has become sedentary due to COVID-19 fortunately he remains active,  Running/exercises etc  --Labs:   CMP, FLP, CBC, A1c, TSH, hep C , testosterone per pt request -Diet and exercise discussed

## 2020-10-03 ENCOUNTER — Encounter: Payer: Self-pay | Admitting: Gastroenterology

## 2020-10-03 ENCOUNTER — Other Ambulatory Visit: Payer: Self-pay

## 2020-10-03 ENCOUNTER — Other Ambulatory Visit (INDEPENDENT_AMBULATORY_CARE_PROVIDER_SITE_OTHER): Payer: BC Managed Care – PPO

## 2020-10-03 DIAGNOSIS — Z23 Encounter for immunization: Secondary | ICD-10-CM

## 2020-10-03 DIAGNOSIS — Z1159 Encounter for screening for other viral diseases: Secondary | ICD-10-CM | POA: Diagnosis not present

## 2020-10-03 DIAGNOSIS — Z0000 Encounter for general adult medical examination without abnormal findings: Secondary | ICD-10-CM

## 2020-10-03 LAB — LIPID PANEL
Cholesterol: 223 mg/dL — ABNORMAL HIGH (ref 0–200)
HDL: 69.3 mg/dL (ref >39.00)
LDL Cholesterol: 132 mg/dL — ABNORMAL HIGH (ref 0–99)
NonHDL: 154.11
Total CHOL/HDL Ratio: 3
Triglycerides: 110 mg/dL (ref 0.0–149.0)
VLDL: 22 mg/dL (ref 0.0–40.0)

## 2020-10-03 LAB — CBC WITH DIFFERENTIAL/PLATELET
Basophils Absolute: 0 10*3/uL (ref 0.0–0.1)
Basophils Relative: 0.6 % (ref 0.0–3.0)
Eosinophils Absolute: 0.1 10*3/uL (ref 0.0–0.7)
Eosinophils Relative: 2.1 % (ref 0.0–5.0)
HCT: 40.1 % (ref 39.0–52.0)
Hemoglobin: 14 g/dL (ref 13.0–17.0)
Lymphocytes Relative: 26.5 % (ref 12.0–46.0)
Lymphs Abs: 1.4 10*3/uL (ref 0.7–4.0)
MCHC: 34.9 g/dL (ref 30.0–36.0)
MCV: 90.3 fl (ref 78.0–100.0)
Monocytes Absolute: 0.4 10*3/uL (ref 0.1–1.0)
Monocytes Relative: 8.5 % (ref 3.0–12.0)
Neutro Abs: 3.2 10*3/uL (ref 1.4–7.7)
Neutrophils Relative %: 62.3 % (ref 43.0–77.0)
Platelets: 176 10*3/uL (ref 150.0–400.0)
RBC: 4.44 Mil/uL (ref 4.22–5.81)
RDW: 12.2 % (ref 11.5–15.5)
WBC: 5.2 10*3/uL (ref 4.0–10.5)

## 2020-10-03 LAB — COMPREHENSIVE METABOLIC PANEL
ALT: 37 U/L (ref 0–53)
AST: 23 U/L (ref 0–37)
Albumin: 4.7 g/dL (ref 3.5–5.2)
Alkaline Phosphatase: 69 U/L (ref 39–117)
BUN: 20 mg/dL (ref 6–23)
CO2: 30 mEq/L (ref 19–32)
Calcium: 9.4 mg/dL (ref 8.4–10.5)
Chloride: 104 mEq/L (ref 96–112)
Creatinine, Ser: 1.01 mg/dL (ref 0.40–1.50)
GFR: 87.43 mL/min (ref >60.00)
Glucose, Bld: 97 mg/dL (ref 70–99)
Potassium: 4.1 mEq/L (ref 3.5–5.1)
Sodium: 141 mEq/L (ref 135–145)
Total Bilirubin: 0.4 mg/dL (ref 0.2–1.2)
Total Protein: 6.7 g/dL (ref 6.0–8.3)

## 2020-10-03 LAB — TSH: TSH: 2.64 u[IU]/mL (ref 0.35–4.50)

## 2020-10-03 LAB — HEMOGLOBIN A1C: Hgb A1c MFr Bld: 5.4 % (ref 4.6–6.5)

## 2020-10-04 ENCOUNTER — Telehealth: Payer: Self-pay | Admitting: Internal Medicine

## 2020-10-04 LAB — HEPATITIS C ANTIBODY
Hepatitis C Ab: NONREACTIVE
SIGNAL TO CUT-OFF: 0 (ref <1.00)

## 2020-10-04 NOTE — Telephone Encounter (Signed)
No problem, please advised to call 3 weeks before his next physical to set that up

## 2020-10-04 NOTE — Telephone Encounter (Signed)
Patient states he would like labs ordered prior to next years CPE on 10/05/2021.   Please Advise

## 2020-10-04 NOTE — Telephone Encounter (Signed)
Mychart message sent to patient to advise. -JMA

## 2020-10-11 ENCOUNTER — Emergency Department (HOSPITAL_BASED_OUTPATIENT_CLINIC_OR_DEPARTMENT_OTHER)
Admission: EM | Admit: 2020-10-11 | Discharge: 2020-10-11 | Disposition: A | Discharge status: Home / Self Care | Payer: BC Managed Care – PPO | Attending: Emergency Medicine | Admitting: Emergency Medicine

## 2020-10-11 ENCOUNTER — Other Ambulatory Visit: Payer: Self-pay

## 2020-10-11 ENCOUNTER — Emergency Department (HOSPITAL_BASED_OUTPATIENT_CLINIC_OR_DEPARTMENT_OTHER): Payer: BC Managed Care – PPO

## 2020-10-11 ENCOUNTER — Encounter (HOSPITAL_BASED_OUTPATIENT_CLINIC_OR_DEPARTMENT_OTHER): Payer: Self-pay

## 2020-10-11 DIAGNOSIS — M25422 Effusion, left elbow: Secondary | ICD-10-CM | POA: Diagnosis not present

## 2020-10-11 DIAGNOSIS — I10 Essential (primary) hypertension: Secondary | ICD-10-CM | POA: Insufficient documentation

## 2020-10-11 DIAGNOSIS — M25522 Pain in left elbow: Secondary | ICD-10-CM | POA: Diagnosis not present

## 2020-10-11 DIAGNOSIS — F900 Attention-deficit hyperactivity disorder, predominantly inattentive type: Secondary | ICD-10-CM | POA: Diagnosis not present

## 2020-10-11 DIAGNOSIS — F411 Generalized anxiety disorder: Secondary | ICD-10-CM | POA: Diagnosis not present

## 2020-10-11 DIAGNOSIS — M71122 Other infective bursitis, left elbow: Secondary | ICD-10-CM | POA: Diagnosis not present

## 2020-10-11 DIAGNOSIS — M7989 Other specified soft tissue disorders: Secondary | ICD-10-CM | POA: Diagnosis not present

## 2020-10-11 MED ORDER — DOXYCYCLINE HYCLATE 100 MG PO CAPS: 100.0000 mg | ORAL_CAPSULE | Freq: Two times a day (BID) | ORAL | 0 refills | Status: DC

## 2020-10-11 MED ORDER — DOXYCYCLINE HYCLATE 100 MG PO TABS
100.0000 mg | ORAL_TABLET | Freq: Once | ORAL | Status: AC
  Administered 2020-10-11: 100 mg via ORAL
  Filled 2020-10-11: qty 1

## 2020-10-11 NOTE — ED Provider Notes (Signed)
[facility] EMERGENCY DEPARTMENT Provider Note   CSN: [id number] Arrival date & time: 10/11/20  1752     History Chief Complaint  Patient presents with  . Elbow Pain    Troy Leonard is a 50 y.o. male presenting for evaluation of rapid onset of left elbow pain and swelling.  He states he works at [employer] and is constantly leaning on his elbows for work.  Yesterday he picked an ingrown hair on his left elbow did not seem inflamed initially.  Today he noticed rapidly worsening swelling and pain to the left elbow with some redness.  Denies any drainage.  He is able to move his elbow without difficulty.  Having some radiating pain down his arm.  Denies fevers or chills.  Denies history of IV drug use or immunocompromise.  He reports history of staph infection many years ago.  No history of gout.  No injuries.  The history is provided by the patient.       Past Medical History:  Diagnosis Date  . ABNORMAL ECHOCARDIOGRAM 06/15/2009   Qualifier: Diagnosis of  By: Angelena Form, MD, Harrell Gave    . Abnormal EKG    LVH, iRBB, sees cards   . Anxiety    sees Dr Caprice Beaver  . ANXIETY 05/19/2008   Qualifier: Diagnosis of  By: Larose Kells MD, [place] 04/06/2008   Qualifier: Diagnosis of  By: Larose Kells MD, [place] Benign neoplasm of skin 06/05/2009   Qualifier: Diagnosis of  By: Larose Kells MD, [place] Dyslipidemia 10/08/2012  . Epididymal cyst 05/20/2011  . Essential hypertension 05/19/2008   Qualifier: Diagnosis of  By: Larose Kells MD, [place] Hyperlipidemia   . Hypertension   . Multiple pigmented nevi    saw derm before   . RBBB 06/15/2009   Qualifier: Diagnosis of  By: Earley Favor RN, BSN, Leonie Man Throat pain 04/03/2017    Patient Active Problem List   Diagnosis Date Noted  . Throat pain 04/03/2017  . PCP NOTES >>>>>>>>>>>>>>>>>>>>>>>>>>>>>. 09/07/2015  . Dyslipidemia 10/08/2012  . Annual physical exam 06/21/2011  . Epididymal cyst 05/20/2011  . RBBB 06/15/2009  . ABNORMAL  ECHOCARDIOGRAM 06/15/2009  . Benign neoplasm of skin 06/05/2009  . ANXIETY 05/19/2008  . Essential hypertension 05/19/2008  . Backache 04/06/2008    Past Surgical History:  Procedure Laterality Date  . FINGER SURGERY    . VASECTOMY  2004       Family History  Problem Relation Age of Onset  . Colon polyps Father        patient believes father may have had around age 21  . Stroke Father   . Atrial fibrillation Father   . Heart disease Mother        21s  . Breast cancer Mother        breast  . Mitral valve prolapse Mother        s/p heart transplant after MI 3-14  . Colon cancer Other        grandmother and grandfather?  . Cancer - Prostate Neg Hx   . Diabetes Neg Hx     Social History   Tobacco Use  . Smoking status: Never Smoker  . Smokeless tobacco: Never Used  Vaping Use  . Vaping Use: Never used  Substance Use Topics  . Alcohol use: Yes    Alcohol/week: 3.0 standard drinks    Types: 3 Glasses of wine per  week    Comment: socially   . Drug use: No    Home Medications Prior to Admission medications   Medication Sig Start Date End Date Taking? Authorizing Provider  arginine 500 MG tablet Take 500 mg by mouth daily.   Yes [provider]  atorvastatin (LIPITOR) 10 MG tablet Take 1 tablet (10 mg total) by mouth daily. 08/07/20  Yes Paz, Alda Berthold, MD  clonazePAM (KLONOPIN) 2 MG tablet Take 2 mg by mouth 3 (three) times daily as needed.    Yes [provider]  doxycycline (VIBRAMYCIN) 100 MG capsule Take 1 capsule (100 mg total) by mouth 2 (two) times daily. 10/11/20  Yes Chrissi Crow, [place] N, PA-C  Lisdexamfetamine Dimesylate (VYVANSE PO) Take by mouth daily.   Yes [provider]  Multiple Vitamin (MULTIVITAMIN) tablet Take 1 tablet by mouth daily.   Yes [provider]  psyllium (METAMUCIL) 58.6 % powder 1 teaspoon once daily   Yes [provider]  WELLBUTRIN XL 150 MG 24 hr tablet Take 150 mg by mouth every morning.  09/15/19  Yes [provider]  sildenafil (REVATIO) 20 MG tablet TAKE 3-4 TABLETS BY MOUTH AT BEDTIME AS NEEDED 08/02/19   Colon Branch, MD    Allergies    Patient has no known allergies.  Review of Systems   Review of Systems  Musculoskeletal:       Elbow pain and swelling  Skin: Positive for color change.    Physical Exam Updated Vital Signs BP (!) 137/94 (BP Location: Left Arm)   Pulse 64   Temp 98 F (36.7 C) (Oral)   Resp 18   Ht 6\' 1"  (1.854 m)   Wt 87.1 kg   SpO2 100%   BMI 25.33 kg/m   Physical Exam Vitals and nursing note reviewed.  Constitutional:      Appearance: He is well-developed.  HENT:     Head: Normocephalic and atraumatic.  Eyes:     Conjunctiva/sclera: Conjunctivae normal.  Cardiovascular:     Rate and Rhythm: Normal rate.  Pulmonary:     Effort: Pulmonary effort is normal.  Musculoskeletal:     Comments: There is localized swelling and mild redness to the olecranon process.  No induration.  Area is tender. No expanding redness. Normal ROM elbow. Normal distal sensation and radial pulse.  Neurological:     Mental Status: He is alert.  Psychiatric:        Mood and Affect: Mood normal.        Behavior: Behavior normal.     ED Results / Procedures / Treatments   Labs (all labs ordered are listed, but only abnormal results are displayed) Labs Reviewed - No data to display  EKG None  Radiology DG Elbow Complete Left  Result Date: 10/11/2020 CLINICAL DATA:  Pain swelling redness EXAM: LEFT ELBOW - COMPLETE 3+ VIEW COMPARISON:  None. FINDINGS: There is no evidence of fracture, dislocation, or joint effusion. There is no evidence of arthropathy or other focal bone abnormality. Posterior soft tissue swelling is seen. IMPRESSION: No acute osseous abnormality.  Posterior soft tissue swelling. Electronically Signed   By: Prudencio Pair M.D.   On: 10/11/2020 18:59    Procedures Procedures   Medications Ordered in ED Medications   doxycycline (VIBRA-TABS) tablet 100 mg (100 mg Oral Given 10/11/20 1924)    ED Course  I have reviewed the triage vital signs and the nursing notes.  Pertinent labs & imaging results that were available during  my care of the patient were reviewed by me and considered in my medical decision making (see chart for details).    MDM Rules/Calculators/A&P                          Patient presenting with rather rapid onset of left posterior elbow swelling and pain that began today.  States he picked at an ingrown hair yesterday in his elbow and thought nothing of it.  He does lean on his elbows quite a bit for work.  No fevers.  No history of IV drug use, immunocompromise, gout.  He is well-appearing and in no distress.  Exam is consistent with bursitis, and considering redness and recent wound as well as rapid progression, there is consideration for developing septic bursitis.  X-ray shows no evidence of joint involvement.  Bedside ultrasound done is not consistent with abscess.  Will treat with compression, cover with doxycycline, NSAIDs for pain.  Recommend he follow with PCP versus sports medicine specialist for which she is given referral.  Strict return precautions including worsening redness, swelling, fever, or other concerning symptoms.  He is discharged in no distress.  Discussed results, findings, treatment and follow up. Patient advised of return precautions. Patient verbalized understanding and agreed with plan.  Final Clinical Impression(s) / ED Diagnoses Final diagnoses:  Septic bursitis of elbow, left    Rx / DC Orders ED Discharge Orders         Ordered    doxycycline (VIBRAMYCIN) 100 MG capsule  2 times daily        10/11/20 1921           Shante Archambeault, [place] N, PA-C 10/11/20 1934    Arnaldo Natal, MD 10/11/20 [phone]

## 2020-10-11 NOTE — ED Notes (Signed)
Patient transported to CT 

## 2020-10-11 NOTE — Discharge Instructions (Signed)
Please take the antibiotic, Doxycycline, every 12 hours until gone. A side effect of this medication includes hypersensitivity to the suns rays - please take measures to protect your skin from the sun while taking this medication. Wear the Ace wrap for compression for swelling.  You can also apply ice for 20 minutes at a time. Treat with over-the-counter medications. Schedule ointment for close follow-up with your primary care provider or the sports medicine specialist.  It is recommended you have recheck by Monday at the latest. Return for worsening swelling/redness, inability to move your joint, fever/chills.

## 2020-10-11 NOTE — ED Notes (Signed)
Pt reports his left elbow has progressively become swollen today, red,tender. Pt has history of staph and wanted to have assessed early. Pt denies n/v/diarrhea/no fever. Pt denies any injury. Pt has been working at his computer today.

## 2020-10-11 NOTE — ED Triage Notes (Signed)
Pt c/o redness/swelling to left elbow x today-NAD-steady gait

## 2020-10-12 ENCOUNTER — Telehealth: Payer: Self-pay

## 2020-10-12 NOTE — Telephone Encounter (Signed)
Nurse Assessment Nurse: Duanne Limerick, RN, Macky Lower Date/Time Eilene Ghazi Time): 10/11/2020 5:26:46 PM Confirm and document reason for call. If symptomatic, describe symptoms. ---Caller states he thinks he has a staph infection in his left elbow. States it started with an ingrown hair. No fever. Has Redness, slightly warm. Does the patient have any new or worsening symptoms? ---Yes Will a triage be completed? ---Yes Related visit to physician within the last 2 weeks? ---No Does the PT have any chronic conditions? (i.e. diabetes, asthma, this includes High risk factors for pregnancy, etc.) ---No Is this a behavioral health or substance abuse call? ---No Guidelines Guideline Title Affirmed Question Affirmed Notes Nurse Date/Time (Eastern Time) Wound Infection [1] Skin around the wound has become red AND [2] larger than 2 inches (5 cm) Duanne Limerick, RN, Macky Lower 10/11/2020 5:28:30 PM Disp. Time Eilene Ghazi Time) Disposition Final User 10/11/2020 5:33:52 PM See HCP within 4 Hours (or PCP triage) Yes Duanne Limerick, RN, Benay Pike Disagree/Comply Comply Caller Understands Yes PLEASE NOTE: All timestamps contained within this report are represented as [place] Standard Time. CONFIDENTIALTY NOTICE: This fax transmission is intended only for the addressee. It contains information that is legally privileged, confidential or otherwise protected from use or disclosure. If you are not the intended recipient, you are strictly prohibited from reviewing, disclosing, copying using or disseminating any of this information or taking any action in reliance on or regarding this information. If you have received this fax in error, please notify us immediately by telephone so that we can arrange for its return to [place]. Phone: [phone], Toll-Free: [phone], Fax: [phone] Page: 2 of 2 Call Id: [id number] PreDisposition Call Doctor Care Advice Given Per Guideline SEE HCP (OR PCP TRIAGE) WITHIN 4 HOURS: * IF OFFICE WILL BE  CLOSED AND NO PCP (PRIMARY CARE PROVIDER) SECOND-LEVEL TRIAGE: You need to be seen within the next 3 or 4 hours. A nearby Urgent Care Center [facility]) is often a good source of care. Another choice is to go to the ED. Go sooner if you become worse. * You become worse CALL BACK IF: CARE ADVICE per Wound Infection (Adult) guideline. Comments User: Charna Elizabeth, RN Date/Time Eilene Ghazi Time): 10/11/2020 5:29:20 PM States elbow pain is 5-6/10 Referrals [facility] - ED  Pt seen in ED last night.

## 2020-10-16 ENCOUNTER — Other Ambulatory Visit: Payer: Self-pay

## 2020-10-16 ENCOUNTER — Encounter: Payer: Self-pay | Admitting: Family Medicine

## 2020-10-16 ENCOUNTER — Ambulatory Visit: Payer: BC Managed Care – PPO | Admitting: Family Medicine

## 2020-10-16 ENCOUNTER — Ambulatory Visit: Payer: BC Managed Care – PPO

## 2020-10-16 ENCOUNTER — Ambulatory Visit (INDEPENDENT_AMBULATORY_CARE_PROVIDER_SITE_OTHER): Payer: BC Managed Care – PPO | Admitting: Family Medicine

## 2020-10-16 VITALS — BP 122/72 | Ht 73.0 in | Wt 192.0 lb

## 2020-10-16 DIAGNOSIS — M71122 Other infective bursitis, left elbow: Secondary | ICD-10-CM | POA: Diagnosis not present

## 2020-10-16 NOTE — Progress Notes (Signed)
Troy Leonard - 50 y.o. male MRN [id number]  Date of birth: 20-Feb-1971  SUBJECTIVE:  Including CC & ROS.  No chief complaint on file.   Troy Leonard is a 50 y.o. male that is presenting with acute left elbow pain.  He was recently seen in the emergency department and diagnosed with an infected bursa.  He has been on doxycycline and reports his symptoms have significantly improved.  No history of similar pain.  Independent review left elbow x-ray from 3/30 shows soft tissue swelling but no joint pathology.  Review of Systems See HPI   HISTORY: Past Medical, Surgical, Social, and Family History Reviewed & Updated per EMR.   Pertinent Historical Findings include:  Past Medical History:  Diagnosis Date  . ABNORMAL ECHOCARDIOGRAM 06/15/2009   Qualifier: Diagnosis of  By: Angelena Form, MD, Harrell Gave    . Abnormal EKG    LVH, iRBB, sees cards   . Anxiety    sees Dr Caprice Beaver  . ANXIETY 05/19/2008   Qualifier: Diagnosis of  By: Larose Kells MD, [place] 04/06/2008   Qualifier: Diagnosis of  By: Larose Kells MD, [place] Benign neoplasm of skin 06/05/2009   Qualifier: Diagnosis of  By: Larose Kells MD, [place] Dyslipidemia 10/08/2012  . Epididymal cyst 05/20/2011  . Essential hypertension 05/19/2008   Qualifier: Diagnosis of  By: Larose Kells MD, [place] Hyperlipidemia   . Hypertension   . Multiple pigmented nevi    saw derm before   . RBBB 06/15/2009   Qualifier: Diagnosis of  By: Earley Favor RN, BSN, Leonie Man Throat pain 04/03/2017    Past Surgical History:  Procedure Laterality Date  . FINGER SURGERY    . VASECTOMY  2004    Family History  Problem Relation Age of Onset  . Colon polyps Father        patient believes father may have had around age 53  . Stroke Father   . Atrial fibrillation Father   . Heart disease Mother        57s  . Breast cancer Mother        breast  . Mitral valve prolapse Mother        s/p heart transplant after MI 3-14  . Colon cancer Other        grandmother  and grandfather?  . Cancer - Prostate Neg Hx   . Diabetes Neg Hx     Social History   Socioeconomic History  . Marital status: Married    Spouse name: Not on file  . Number of children: 2  . Years of education: Not on file  . Highest education level: Not on file  Occupational History  . Occupation: works for a Environmental consultant: [employer]  Tobacco Use  . Smoking status: Never Smoker  . Smokeless tobacco: Never Used  Vaping Use  . Vaping Use: Never used  Substance and Sexual Activity  . Alcohol use: Yes    Alcohol/week: 3.0 standard drinks    Types: 3 Glasses of wine per week    Comment: socially   . Drug use: No  . Sexual activity: Yes  Other Topics Concern  . Not on file  Social History Narrative   Household: pt wife    2 children: 2001 2003 both in [place] Strain: Not on file  Food Insecurity: Not on file  Transportation Needs: Not on file  Physical Activity: Not on file  Stress: Not on file  Social Connections: Not on file  Intimate Partner Violence: Not on file     PHYSICAL EXAM:  VS: BP 122/72 (BP Location: Right Arm, Patient Position: Sitting, Cuff Size: Large)   Ht 6\' 1"  (1.854 m)   Wt 192 lb (87.1 kg)   BMI 25.33 kg/m  Physical Exam Gen: NAD, alert, cooperative with exam, well-appearing MSK:  Left elbow: No significant redness or swelling of the olecranon bursa. Normal range of motion. No tenderness to palpation over the olecranon. No streaking or warmth. Neurovascular intact     ASSESSMENT & PLAN:   Septic olecranon bursitis of left elbow Resolving infection.  No effusion noted on exam of the bursa.  Has full range of motion. -Counseled on home exercise therapy and supportive care. -Complete current course of doxycycline.

## 2020-10-16 NOTE — Patient Instructions (Signed)
Nice to meet you Please complete the antibiotics   Please send me a message in MyChart with any questions or updates.  Please see [place] back as needed.   --Dr. Raeford Razor

## 2020-10-17 DIAGNOSIS — M71122 Other infective bursitis, left elbow: Secondary | ICD-10-CM | POA: Insufficient documentation

## 2020-10-17 NOTE — Assessment & Plan Note (Signed)
Resolving infection.  No effusion noted on exam of the bursa.  Has full range of motion. -Counseled on home exercise therapy and supportive care. -Complete current course of doxycycline.

## 2020-10-24 DIAGNOSIS — F411 Generalized anxiety disorder: Secondary | ICD-10-CM | POA: Diagnosis not present

## 2020-10-24 DIAGNOSIS — F900 Attention-deficit hyperactivity disorder, predominantly inattentive type: Secondary | ICD-10-CM | POA: Diagnosis not present

## 2020-11-14 ENCOUNTER — Other Ambulatory Visit: Payer: Self-pay

## 2020-11-14 ENCOUNTER — Ambulatory Visit (AMBULATORY_SURGERY_CENTER): Payer: Self-pay | Admitting: *Deleted

## 2020-11-14 VITALS — Ht 73.0 in | Wt 190.6 lb

## 2020-11-14 DIAGNOSIS — F900 Attention-deficit hyperactivity disorder, predominantly inattentive type: Secondary | ICD-10-CM | POA: Diagnosis not present

## 2020-11-14 DIAGNOSIS — Z1211 Encounter for screening for malignant neoplasm of colon: Secondary | ICD-10-CM

## 2020-11-14 DIAGNOSIS — F411 Generalized anxiety disorder: Secondary | ICD-10-CM | POA: Diagnosis not present

## 2020-11-14 MED ORDER — PEG-KCL-NACL-NASULF-NA ASC-C 100 G PO SOLR: 1.0000 | Freq: Once | ORAL | 0 refills | Status: AC

## 2020-11-14 NOTE — Progress Notes (Signed)

## 2020-11-28 DIAGNOSIS — Z79891 Long term (current) use of opiate analgesic: Secondary | ICD-10-CM | POA: Diagnosis not present

## 2020-11-28 DIAGNOSIS — F900 Attention-deficit hyperactivity disorder, predominantly inattentive type: Secondary | ICD-10-CM | POA: Diagnosis not present

## 2020-11-28 DIAGNOSIS — F411 Generalized anxiety disorder: Secondary | ICD-10-CM | POA: Diagnosis not present

## 2020-12-01 ENCOUNTER — Other Ambulatory Visit: Payer: Self-pay

## 2020-12-01 ENCOUNTER — Encounter: Payer: Self-pay | Admitting: Gastroenterology

## 2020-12-01 ENCOUNTER — Ambulatory Visit (AMBULATORY_SURGERY_CENTER): Payer: BC Managed Care – PPO | Admitting: Gastroenterology

## 2020-12-01 VITALS — BP 122/84 | HR 64 | Temp 97.7°F | Resp 12 | Ht 73.0 in | Wt 190.0 lb

## 2020-12-01 DIAGNOSIS — Z1211 Encounter for screening for malignant neoplasm of colon: Secondary | ICD-10-CM

## 2020-12-01 DIAGNOSIS — D128 Benign neoplasm of rectum: Secondary | ICD-10-CM | POA: Diagnosis not present

## 2020-12-01 DIAGNOSIS — D123 Benign neoplasm of transverse colon: Secondary | ICD-10-CM | POA: Diagnosis not present

## 2020-12-01 MED ORDER — SODIUM CHLORIDE 0.9 % IV SOLN: 500.0000 mL | Freq: Once | INTRAVENOUS | Status: DC

## 2020-12-01 NOTE — Op Note (Signed)
[facility] Patient Name: Troy Leonard Procedure Date: 12/01/2020 3:13 PM MRN: [id number] Endoscopist: Mallie Mussel L. Loletha Carrow , MD Age: 50 Referring MD:  Date of Birth: 07/07/1971 Gender: Male Account #: [id number] Procedure:                Colonoscopy Indications:              Screening for colorectal malignant neoplasm, This                            is the patient's first colonoscopy Medicines:                Monitored Anesthesia Care Procedure:                Pre-Anesthesia Assessment:                           - Prior to the procedure, a History and Physical                            was performed, and patient medications and                            allergies were reviewed. The patient's tolerance of                            previous anesthesia was also reviewed. The risks                            and benefits of the procedure and the sedation                            options and risks were discussed with the patient.                            All questions were answered, and informed consent                            was obtained. Prior Anticoagulants: The patient has                            taken no previous anticoagulant or antiplatelet                            agents. ASA Grade Assessment: II - A patient with                            mild systemic disease. After reviewing the risks                            and benefits, the patient was deemed in                            satisfactory condition to undergo the procedure.  After obtaining informed consent, the colonoscope                            was passed under direct vision. Throughout the                            procedure, the patient's blood pressure, pulse, and                            oxygen saturations were monitored continuously. The                            Colonoscope was introduced through the anus and                            advanced to the the terminal  ileum, with                            identification of the appendiceal orifice and IC                            valve. The colonoscopy was somewhat difficult due                            to a redundant colon and significant looping.                            Successful completion of the procedure was aided by                            using manual pressure. The patient tolerated the                            procedure well. The quality of the bowel                            preparation was good after lavage. The ileocecal                            valve, appendiceal orifice, and rectum were                            photographed. Scope In: 3:31:16 PM Scope Out: 3:54:34 PM Scope Withdrawal Time: 0 hours 15 minutes 26 seconds  Total Procedure Duration: 0 hours 23 minutes 18 seconds  Findings:                 The perianal and digital rectal examinations were                            normal.                           Two sessile polyps were found in the rectum and  transverse colon. The polyps were 3 to 4 mm in                            size. These polyps were removed with a cold snare.                            Resection and retrieval were complete.                           The exam was otherwise without abnormality on                            direct and retroflexion views. Complications:            No immediate complications. Estimated Blood Loss:     Estimated blood loss was minimal. Impression:               - Two 3 to 4 mm polyps in the rectum and in the                            transverse colon, removed with a cold snare.                            Resected and retrieved.                           - The examination was otherwise normal on direct                            and retroflexion views. Recommendation:           - Patient has a contact number available for                            emergencies. The signs and symptoms of potential                             delayed complications were discussed with the                            patient. Return to normal activities tomorrow.                            Written discharge instructions were provided to the                            patient.                           - Resume previous diet.                           - Continue present medications.                           - Await pathology results.                           -  Repeat colonoscopy is recommended for                            surveillance. The colonoscopy date will be                            determined after pathology results from today's                            exam become available for review. (consume more                            water with prep for next colonoscopy) Mallie Mussel L. Loletha Carrow, MD 12/01/2020 3:59:59 PM This report has been signed electronically.

## 2020-12-01 NOTE — Progress Notes (Signed)
Called to room to assist during endoscopic procedure.  Patient ID and intended procedure confirmed with present staff. Received instructions for my participation in the procedure from the performing physician.  

## 2020-12-01 NOTE — Progress Notes (Signed)
[facility]  Pt's states no medical or surgical changes since previsit or office visit.  

## 2020-12-01 NOTE — Patient Instructions (Signed)
Information on polyps given to you today.  Await pathology results.  Resume previous diet and medications.  YOU HAD AN ENDOSCOPIC PROCEDURE TODAY AT THE [facility]:   Refer to the procedure report that was given to you for any specific questions about what was found during the examination.  If the procedure report does not answer your questions, please call your gastroenterologist to clarify.  If you requested that your care partner not be given the details of your procedure findings, then the procedure report has been included in a sealed envelope for you to review at your convenience later.  YOU SHOULD EXPECT: Some feelings of bloating in the abdomen. Passage of more gas than usual.  Walking can help get rid of the air that was put into your GI tract during the procedure and reduce the bloating. If you had a lower endoscopy (such as a colonoscopy or flexible sigmoidoscopy) you may notice spotting of blood in your stool or on the toilet paper. If you underwent a bowel prep for your procedure, you may not have a normal bowel movement for a few days.  Please Note:  You might notice some irritation and congestion in your nose or some drainage.  This is from the oxygen used during your procedure.  There is no need for concern and it should clear up in a day or so.  SYMPTOMS TO REPORT IMMEDIATELY:   Following lower endoscopy (colonoscopy or flexible sigmoidoscopy):  Excessive amounts of blood in the stool  Significant tenderness or worsening of abdominal pains  Swelling of the abdomen that is new, acute  Fever of 100F or higher   For urgent or emergent issues, a gastroenterologist can be reached at any hour by calling [phone]. Do not use MyChart messaging for urgent concerns.    DIET:  We do recommend a small meal at first, but then you may proceed to your regular diet.  Drink plenty of fluids but you should avoid alcoholic beverages for 24 hours.  ACTIVITY:  You should  plan to take it easy for the rest of today and you should NOT DRIVE or use heavy machinery until tomorrow (because of the sedation medicines used during the test).    FOLLOW UP: Our staff will call the number listed on your records 48-72 hours following your procedure to check on you and address any questions or concerns that you may have regarding the information given to you following your procedure. If we do not reach you, we will leave a message.  We will attempt to reach you two times.  During this call, we will ask if you have developed any symptoms of COVID 19. If you develop any symptoms (ie: fever, flu-like symptoms, shortness of breath, cough etc.) before then, please call [phone].  If you test positive for Covid 19 in the 2 weeks post procedure, please call and report this information to us.    If any biopsies were taken you will be contacted by phone or by letter within the next 1-3 weeks.  Please call us at [phone] if you have not heard about the biopsies in 3 weeks.    SIGNATURES/CONFIDENTIALITY: You and/or your care partner have signed paperwork which will be entered into your electronic medical record.  These signatures attest to the fact that that the information above on your After Visit Summary has been reviewed and is understood.  Full responsibility of the confidentiality of this discharge information lies with you and/or your care-partner. 

## 2020-12-01 NOTE — Progress Notes (Signed)
A/ox3, pleased with MAC, report to RN 

## 2020-12-05 ENCOUNTER — Telehealth: Payer: Self-pay

## 2020-12-05 NOTE — Telephone Encounter (Signed)
  Follow up Call-  Call back number 12/01/2020  Post procedure Call Back phone  # [phone]  Permission to leave phone message Yes  Some recent data might be hidden     Patient questions:  Do you have a fever, pain , or abdominal swelling? No. Pain Score  0 *  Have you tolerated food without any problems? Yes.    Have you been able to return to your normal activities? Yes.    Do you have any questions about your discharge instructions: Diet   No. Medications  No. Follow up visit  No.  Do you have questions or concerns about your Care? No.  Actions: * If pain score is 4 or above: No action needed, pain <4.  1. Have you developed a fever since your procedure? no  2.   Have you had an respiratory symptoms (SOB or cough) since your procedure? no  3.   Have you tested positive for COVID 19 since your procedure no  4.   Have you had any family members/close contacts diagnosed with the COVID 19 since your procedure?  no   If yes to any of these questions please route to Joylene John, RN and Joella Prince, RN

## 2020-12-10 ENCOUNTER — Encounter: Payer: Self-pay | Admitting: Gastroenterology

## 2020-12-13 DIAGNOSIS — F900 Attention-deficit hyperactivity disorder, predominantly inattentive type: Secondary | ICD-10-CM | POA: Diagnosis not present

## 2020-12-13 DIAGNOSIS — F411 Generalized anxiety disorder: Secondary | ICD-10-CM | POA: Diagnosis not present

## 2020-12-15 ENCOUNTER — Telehealth (INDEPENDENT_AMBULATORY_CARE_PROVIDER_SITE_OTHER): Payer: BC Managed Care – PPO | Admitting: Internal Medicine

## 2020-12-15 ENCOUNTER — Telehealth: Payer: Self-pay | Admitting: Internal Medicine

## 2020-12-15 ENCOUNTER — Other Ambulatory Visit (HOSPITAL_BASED_OUTPATIENT_CLINIC_OR_DEPARTMENT_OTHER): Payer: Self-pay

## 2020-12-15 ENCOUNTER — Encounter: Payer: Self-pay | Admitting: Internal Medicine

## 2020-12-15 VITALS — HR 96 | Temp 100.7°F | Ht 73.0 in | Wt 192.0 lb

## 2020-12-15 DIAGNOSIS — U071 COVID-19: Secondary | ICD-10-CM

## 2020-12-15 MED ORDER — MOLNUPIRAVIR EUA 200MG CAPSULE
4.0000 | ORAL_CAPSULE | Freq: Two times a day (BID) | ORAL | 0 refills | Status: AC
  Filled 2020-12-15: qty 40, 5d supply, fill #0

## 2020-12-15 NOTE — Telephone Encounter (Signed)
Pt, was tested Covid positive this morning he would like a prescription  Please advice

## 2020-12-15 NOTE — Progress Notes (Signed)
Subjective:    Patient ID: Troy Leonard, male    DOB: [birth year], 50 y.o.   MRN: [id number]  DOS:  12/15/2020 Type of visit - description: Virtual Visit via Telephone    I connected with above mentioned patient  by telephone and verified that I am speaking with the correct person using two identifiers.  THIS ENCOUNTER IS A VIRTUAL VISIT DUE TO COVID-19 - PATIENT WAS NOT SEEN IN THE OFFICE. PATIENT HAS CONSENTED TO VIRTUAL VISIT / TELEMEDICINE VISIT   Location of patient: home  Location of provider: office  Persons participating in the virtual visit: patient, provider   I discussed the limitations, risks, security and privacy concerns of performing an evaluation and management service by telephone and the availability of in person appointments. I also discussed with the patient that there may be a patient responsible charge related to this service. The patient expressed understanding and agreed to proceed.  Acute: Yesterday developed symptoms: Dry cough, sneezing, sinus congestion, headache, subjective fever.  Checked his temperature: 99.5. Have some mid anterior chest pressure. Took some Advil and Tylenol, felt somewhat better.   Today, his temperature was 100.7, took a COVID test, it came back positive. Chest pain has decreased Still coughing some. Minimal shortness of breath.  Normal O2 sats No sputum production No nausea, vomiting, diarrhea     Review of Systems See above   Past Medical History:  Diagnosis Date  . ABNORMAL ECHOCARDIOGRAM 06/15/2009   Qualifier: Diagnosis of  By: Angelena Form, MD, [place] Gave    . Abnormal EKG    LVH, iRBB, sees cards   . Anxiety    sees Dr Caprice Beaver  . ANXIETY 05/19/2008   Qualifier: Diagnosis of  By: Larose Kells MD, [place] 04/06/2008   Qualifier: Diagnosis of  By: Larose Kells MD, [place] Benign neoplasm of skin 06/05/2009   Qualifier: Diagnosis of  By: Larose Kells MD, [place] Dyslipidemia 10/08/2012  . Epididymal cyst 05/20/2011  .  Essential hypertension 05/19/2008   Qualifier: Diagnosis of  By: Larose Kells MD, [place] Hyperlipidemia   . Hypertension   . Multiple pigmented nevi    saw derm before   . RBBB 06/15/2009   Qualifier: Diagnosis of  By: Earley Favor RN, BSN, [place] Sleep apnea    WEARS CPAP   . Throat pain 04/03/2017    Past Surgical History:  Procedure Laterality Date  . FINGER SURGERY     AS A CHILD   . TOOTH EXTRACTION     WITH SEDATION, NOT WISDOM TEETH  . VASECTOMY  2004    Allergies as of 12/15/2020   No Known Allergies     Medication List       Accurate as of December 15, 2020 11:47 AM. If you have any questions, ask your nurse or doctor.        arginine 500 MG tablet Take 500 mg by mouth daily.   atorvastatin 10 MG tablet Commonly known as: LIPITOR Take 1 tablet (10 mg total) by mouth daily.   clonazePAM 2 MG tablet Commonly known as: KLONOPIN Take 2 mg by mouth 3 (three) times daily as needed.   multivitamin tablet Take 1 tablet by mouth daily.   psyllium 58.6 % powder Commonly known as: METAMUCIL 1 teaspoon once daily   sildenafil 20 MG tablet Commonly known as: REVATIO TAKE 3-4 TABLETS BY MOUTH AT BEDTIME AS NEEDED   Vyvanse 50  MG capsule Generic drug: lisdexamfetamine Take 50 mg by mouth every morning.   Wellbutrin XL 150 MG 24 hr tablet Generic drug: buPROPion Take 150 mg by mouth every morning.          Objective:   Physical Exam Pulse 96   Ht 6\' 1"  (1.854 m)   Wt 192 lb (87.1 kg)   SpO2 98%   BMI 25.33 kg/m  Over the phone, he sounded alert oriented x3, speaking in complete sentences, no distress.  No cough noted.    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 Hoover Browns (clonazepam) Multiple nevus, sees derm  PLAN: COVID-19: The patient is 50, he had 3  COVID vaccines. Advised patient that treatment with medication will decrease his risk of getting sicker. I initially recommended Paxlovid but that will require to stop Wellbutrin and clonazepam temporarily.  The patient reports he will have a very difficult time stopping clonazepam thus recommend  Molnupiravir. Prescription sent. Also recommend rest, fluids, Tylenol, Robitussin-DM. Watch for worsening symptoms, call if that is the case.  Call if not gradually better. He has a funeral next week, see patient's message, he will be contagious for at least the next 7 days.  Patient aware.  I discussed the assessment and treatment plan with the patient. The patient was provided an opportunity to ask questions and all were answered. The patient agreed with the plan and demonstrated an understanding of the instructions.   The patient was advised to call back or seek an in-person evaluation if the symptoms worsen or if the condition fails to improve as anticipated.  I provided 28 minutes of non-face-to-face time during this encounter.  Kathlene November, MD

## 2020-12-15 NOTE — Telephone Encounter (Signed)
Needs virtual visit 

## 2020-12-16 NOTE — Assessment & Plan Note (Signed)
COVID-19: The patient is 38, he had 3 COVID vaccines. Advised patient that treatment with medication will decrease his risk of getting sicker. I initially recommended Paxlovid but that will require to stop Wellbutrin and clonazepam temporarily.  The patient reports he will have a very difficult time stopping clonazepam thus recommend  Molnupiravir. Prescription sent. Also recommend rest, fluids, Tylenol, Robitussin-DM. Watch for worsening symptoms, call if that is the case.  Call if not gradually better. He has a funeral next week, see patient's message, he will be contagious for at least the next 7 days.  Patient aware.

## 2020-12-25 ENCOUNTER — Other Ambulatory Visit (HOSPITAL_BASED_OUTPATIENT_CLINIC_OR_DEPARTMENT_OTHER): Payer: Self-pay

## 2021-01-03 DIAGNOSIS — F900 Attention-deficit hyperactivity disorder, predominantly inattentive type: Secondary | ICD-10-CM | POA: Diagnosis not present

## 2021-01-03 DIAGNOSIS — F411 Generalized anxiety disorder: Secondary | ICD-10-CM | POA: Diagnosis not present

## 2021-01-24 DIAGNOSIS — G4733 Obstructive sleep apnea (adult) (pediatric): Secondary | ICD-10-CM | POA: Diagnosis not present

## 2021-01-26 DIAGNOSIS — G4733 Obstructive sleep apnea (adult) (pediatric): Secondary | ICD-10-CM | POA: Diagnosis not present

## 2021-01-27 ENCOUNTER — Other Ambulatory Visit: Payer: Self-pay | Admitting: Internal Medicine

## 2021-01-31 DIAGNOSIS — F900 Attention-deficit hyperactivity disorder, predominantly inattentive type: Secondary | ICD-10-CM | POA: Diagnosis not present

## 2021-01-31 DIAGNOSIS — F411 Generalized anxiety disorder: Secondary | ICD-10-CM | POA: Diagnosis not present

## 2021-02-27 DIAGNOSIS — F900 Attention-deficit hyperactivity disorder, predominantly inattentive type: Secondary | ICD-10-CM | POA: Diagnosis not present

## 2021-02-27 DIAGNOSIS — F411 Generalized anxiety disorder: Secondary | ICD-10-CM | POA: Diagnosis not present

## 2021-04-06 DIAGNOSIS — F411 Generalized anxiety disorder: Secondary | ICD-10-CM | POA: Diagnosis not present

## 2021-04-06 DIAGNOSIS — F900 Attention-deficit hyperactivity disorder, predominantly inattentive type: Secondary | ICD-10-CM | POA: Diagnosis not present

## 2021-04-20 DIAGNOSIS — F900 Attention-deficit hyperactivity disorder, predominantly inattentive type: Secondary | ICD-10-CM | POA: Diagnosis not present

## 2021-04-20 DIAGNOSIS — F411 Generalized anxiety disorder: Secondary | ICD-10-CM | POA: Diagnosis not present

## 2021-05-11 DIAGNOSIS — F900 Attention-deficit hyperactivity disorder, predominantly inattentive type: Secondary | ICD-10-CM | POA: Diagnosis not present

## 2021-05-11 DIAGNOSIS — F411 Generalized anxiety disorder: Secondary | ICD-10-CM | POA: Diagnosis not present

## 2021-05-17 ENCOUNTER — Other Ambulatory Visit: Payer: Self-pay

## 2021-05-17 ENCOUNTER — Ambulatory Visit: Payer: BC Managed Care – PPO | Attending: Internal Medicine

## 2021-05-17 ENCOUNTER — Ambulatory Visit (INDEPENDENT_AMBULATORY_CARE_PROVIDER_SITE_OTHER): Payer: BC Managed Care – PPO | Admitting: Internal Medicine

## 2021-05-17 VITALS — BP 144/80 | HR 81 | Temp 98.4°F | Resp 18 | Ht 73.0 in | Wt 196.0 lb

## 2021-05-17 DIAGNOSIS — Z23 Encounter for immunization: Secondary | ICD-10-CM

## 2021-05-17 DIAGNOSIS — L245 Irritant contact dermatitis due to other chemical products: Secondary | ICD-10-CM

## 2021-05-17 MED ORDER — PREDNISONE 10 MG PO TABS: ORAL_TABLET | ORAL | 0 refills | Status: DC

## 2021-05-17 MED ORDER — TRIAMCINOLONE ACETONIDE 0.1 % EX LOTN: 1.0000 "application " | TOPICAL_LOTION | Freq: Three times a day (TID) | CUTANEOUS | 1 refills | Status: DC

## 2021-05-17 NOTE — Progress Notes (Signed)
Subjective:    Patient ID: Troy Leonard, male    DOB: 1971/07/05, 50 y.o.   MRN: [id number]  DOS:  05/17/2021 Type of visit - description: Acute visit  The patient participated on a race [employer]) approximately 2 weeks ago. At the time he used sunscreen on his lower extremities and arms. He has a new tattoo on the R leg and he actually cover the tattoo and did not put the sunscreen there.  Shortly after the race he developed a itchy rash. Denies any fever chills. No lip or tongue swelling. Has been using  calamine and Benadryl but the rash persists, it is still slightly pruritic.  Review of Systems See above   Past Medical History:  Diagnosis Date   ABNORMAL ECHOCARDIOGRAM 06/15/2009   Qualifier: Diagnosis of  By: Angelena Form, MD, [place]     Abnormal EKG    LVH, iRBB, sees cards    Anxiety    sees Dr Caprice Beaver   ANXIETY 05/19/2008   Qualifier: Diagnosis of  By: Larose Kells MD, [place] 04/06/2008   Qualifier: Diagnosis of  By: Larose Kells MD, [place]    Benign neoplasm of skin 06/05/2009   Qualifier: Diagnosis of  By: Larose Kells MD, [place]    Dyslipidemia 10/08/2012   Epididymal cyst 05/20/2011   Essential hypertension 05/19/2008   Qualifier: Diagnosis of  By: Larose Kells MD, [place]    Hyperlipidemia    Hypertension    Multiple pigmented nevi    saw derm before    RBBB 06/15/2009   Qualifier: Diagnosis of  By: Earley Favor, RN, BSN, Leonie Man    Sleep apnea    WEARS CPAP    Throat pain 04/03/2017    Past Surgical History:  Procedure Laterality Date   FINGER SURGERY     AS A CHILD    TOOTH EXTRACTION     WITH SEDATION, NOT WISDOM TEETH   VASECTOMY  2004    Allergies as of 05/17/2021   No Known Allergies      Medication List        Accurate as of May 17, 2021 11:59 PM. If you have any questions, ask your nurse or doctor.          arginine 500 MG tablet Take 500 mg by mouth daily.   atorvastatin 10 MG tablet Commonly known as: LIPITOR TAKE 1 TABLET BY MOUTH EVERY  DAY   clonazePAM 2 MG tablet Commonly known as: KLONOPIN Take 2 mg by mouth 3 (three) times daily as needed.   multivitamin tablet Take 1 tablet by mouth daily.   predniSONE 10 MG tablet Commonly known as: DELTASONE 4 tablets x 2 days, 3 tabs x 2 days, 2 tabs x 2 days, 1 tab x 2 days Started by: Kathlene November, MD   psyllium 58.6 % powder Commonly known as: METAMUCIL 1 teaspoon once daily   sildenafil 20 MG tablet Commonly known as: REVATIO TAKE 3-4 TABLETS BY MOUTH AT BEDTIME AS NEEDED. PRIOR AUTH DENIED. PLACED ON DISCOUNT CARD.   triamcinolone lotion 0.1 % Commonly known as: KENALOG Apply 1 application topically 3 (three) times daily. Started by: Kathlene November, MD   Vyvanse 50 MG capsule Generic drug: lisdexamfetamine Take 50 mg by mouth every morning.   Wellbutrin XL 150 MG 24 hr tablet Generic drug: buPROPion Take 150 mg by mouth every morning.           Objective:   Physical Exam BP (!) 144/80  Pulse 81   Temp 98.4 F (36.9 C)   Resp 18   Ht 6\' 1"  (1.854 m)   Wt 196 lb (88.9 kg)   SpO2 98%   BMI 25.86 kg/m  General:   Well developed, NAD, BMI noted. HEENT:  Normocephalic . Face symmetric, atraumatic  Lower extremities: Trace pedal edema.  Good pedal pulses bilaterally Skin: Rash at the lower extremities, much less noticeable near the tattoo on the R leg (where he did not put much sunscreen and had the area cover).  See picture. Neurologic:  alert & oriented X3.  Speech normal, gait appropriate for age and unassisted Psych--  Cognition and judgment appear intact.  Cooperative with normal attention span and concentration.  Behavior appropriate. No anxious or depressed appearing.         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 Hoover Browns  (clonazepam) Multiple nevus, sees derm Covid infex 12-2020   PLAN: Contact dermatitis: The patient developed itchy rash at the area which he applied sunscreen (a brand that he has not used before).  See picture, the area where did not put sunscreen is almost rash free. Based on these, suspect a contact dermatitis without systemic symptoms. Plan: Avoid that particular sunscreen Still itching thus will prescribe some prednisone, continue antihistaminics, suggest Allegra (see AVS), also topical triamcinolone Call if not gradually better    This visit occurred during the SARS-CoV-2 public health emergency.  Safety protocols were in place, including screening questions prior to the visit, additional usage of staff PPE, and extensive cleaning of exam room while observing appropriate contact time as indicated for disinfecting solutions.

## 2021-05-17 NOTE — Progress Notes (Signed)
   Covid-19 Vaccination Clinic  Name:  Troy Leonard    MRN: [id number] DOB: 12/25/1970  05/17/2021  [name] was observed post Covid-19 immunization for 15 minutes without incident. He was provided with Vaccine Information Sheet and instruction to access the V-Safe system.   [name] was instructed to call 911 with any severe reactions post vaccine: Difficulty breathing  Swelling of face and throat  A fast heartbeat  A bad rash all over body  Dizziness and weakness   Immunizations Administered     Name Date Dose VIS Date Route   Pfizer Covid-19 Vaccine Bivalent Booster 05/17/2021  2:45 PM 0.3 mL 03/14/2021 Intramuscular   Manufacturer: Uncertain   Lot: [id number]   [place]: [phone]

## 2021-05-17 NOTE — Patient Instructions (Signed)
Change sunscreen brand  Prednisone as prescribed  Allegra over-the-counter antihistaminic.  Okay to take 1 additional Benadryl at bedtime if needed  Apply the steroid lotion twice daily.  You have a refill just in case  Call if not gradually back to normal.

## 2021-05-18 NOTE — Assessment & Plan Note (Signed)
Contact dermatitis: The patient developed itchy rash at the area which he applied sunscreen (a brand that he has not used before).  See picture, the area where did not put sunscreen is almost rash free. Based on these, suspect a contact dermatitis without systemic symptoms. Plan: Avoid that particular sunscreen Still itching thus will prescribe some prednisone, continue antihistaminics, suggest Allegra (see AVS), also topical triamcinolone Call if not gradually better

## 2021-05-23 ENCOUNTER — Ambulatory Visit: Payer: BC Managed Care – PPO

## 2021-05-28 DIAGNOSIS — F900 Attention-deficit hyperactivity disorder, predominantly inattentive type: Secondary | ICD-10-CM | POA: Diagnosis not present

## 2021-05-28 DIAGNOSIS — F411 Generalized anxiety disorder: Secondary | ICD-10-CM | POA: Diagnosis not present

## 2021-06-05 ENCOUNTER — Other Ambulatory Visit (HOSPITAL_BASED_OUTPATIENT_CLINIC_OR_DEPARTMENT_OTHER): Payer: Self-pay

## 2021-06-05 MED ORDER — PFIZER COVID-19 VAC BIVALENT 30 MCG/0.3ML IM SUSP
INTRAMUSCULAR | 0 refills | Status: DC
  Filled 2021-06-05: qty 0.3, 1d supply, fill #0

## 2021-06-06 DIAGNOSIS — F900 Attention-deficit hyperactivity disorder, predominantly inattentive type: Secondary | ICD-10-CM | POA: Diagnosis not present

## 2021-06-06 DIAGNOSIS — F411 Generalized anxiety disorder: Secondary | ICD-10-CM | POA: Diagnosis not present

## 2021-06-12 ENCOUNTER — Encounter: Payer: Self-pay | Admitting: Cardiology

## 2021-06-12 ENCOUNTER — Telehealth (INDEPENDENT_AMBULATORY_CARE_PROVIDER_SITE_OTHER): Payer: BC Managed Care – PPO | Admitting: Cardiology

## 2021-06-12 ENCOUNTER — Other Ambulatory Visit: Payer: Self-pay

## 2021-06-12 VITALS — HR 58 | Ht 73.0 in | Wt 194.0 lb

## 2021-06-12 DIAGNOSIS — E7800 Pure hypercholesterolemia, unspecified: Secondary | ICD-10-CM

## 2021-06-12 DIAGNOSIS — I10 Essential (primary) hypertension: Secondary | ICD-10-CM

## 2021-06-12 DIAGNOSIS — Z8249 Family history of ischemic heart disease and other diseases of the circulatory system: Secondary | ICD-10-CM

## 2021-06-12 DIAGNOSIS — G4719 Other hypersomnia: Secondary | ICD-10-CM | POA: Diagnosis not present

## 2021-06-12 DIAGNOSIS — G4733 Obstructive sleep apnea (adult) (pediatric): Secondary | ICD-10-CM

## 2021-06-12 NOTE — Progress Notes (Signed)
Virtual Visit via Video Note   This visit type was conducted due to national recommendations for restrictions regarding the COVID-19 Pandemic (e.g. social distancing) in an effort to limit this patient's exposure and mitigate transmission in our community.  Due to his co-morbid illnesses, this patient is at least at moderate risk for complications without adequate follow up.  This format is felt to be most appropriate for this patient at this time.   All issues noted in this document were discussed and addressed.  Please refer to the patient's chart for his  consent to telehealth for [facility].  Evaluation Performed: Followup   Date:  06/12/2021   ID:  Troy Leonard, DOB 1970-11-04, MRN [id number]  Patient Location:  Home  Provider location:   [place]  PCP:  Colon Branch, MD  Cardiologist:  Lauree Chandler Sleep Medicine:   Fransico Him, MD Electrophysiologist:  None   Chief Complaint:  OSA  History of Present Illness:    Troy Leonard is a 50 y.o. male who presents via audio/video conferencing for a telehealth visit today.    This is a 50yo male with a hx of HTN, HLD and RBBB.  He is referred in consultation from Dr. Larose Kells for evaluation of possible OSA due to significant snoring.  He told his PCP that he was concerned that he might have sleep apnea.  He thought his problems with snoring was related to a deviated nasal septum.  Occasionally his wife would notice that he would have episodes of apnea during sleep.  Occasionally he would wake up at night but not gasping for breath.  At his last OV we decided to proceed with home sleep study.  This showed an AHI of 7.6/hr with severe O2 desaturations to 76%.  He was set up of auto CPAP and is now here for followup.   He is doing well with his CPAP device and thinks that he has gotten used to it.  He tolerates the mask and feels the pressure is adequate but sometimes wakes up at night and would like to have more air pressure at  times.  Since going on CPAP h feels rested in the am and has no significant daytime sleepiness.  He occasionally has some mouth and nasal dryness but no nasal congestion.  He does not think that he snores.     He also is concerned about his risk of cardiac disease.  He has a fm hx of DCM in his mom who was dx in her 26's and is concerned also about the stress in his job and the effects on possible risk of CAD.  The patient does not have symptoms concerning for COVID-19 infection (fever, chills, cough, or new shortness of breath).   Prior CV studies:   The following studies were reviewed today:  Home sleep study and PAP compliance download  Past Medical History:  Diagnosis Date   ABNORMAL ECHOCARDIOGRAM 06/15/2009   Qualifier: Diagnosis of  By: Angelena Form, MD, [place]     Abnormal EKG    LVH, iRBB, sees cards    Anxiety    sees Dr Caprice Beaver   ANXIETY 05/19/2008   Qualifier: Diagnosis of  By: Larose Kells MD, [place] 04/06/2008   Qualifier: Diagnosis of  By: Larose Kells MD, [place]    Benign neoplasm of skin 06/05/2009   Qualifier: Diagnosis of  By: Larose Kells MD, [place]    Dyslipidemia 10/08/2012   Epididymal cyst 05/20/2011  Essential hypertension 05/19/2008   Qualifier: Diagnosis of  By: Larose Kells MD, [place]    Hyperlipidemia    Hypertension    Multiple pigmented nevi    saw derm before    RBBB 06/15/2009   Qualifier: Diagnosis of  By: Earley Favor, RN, BSN, Leonie Man    Sleep apnea    WEARS CPAP    Throat pain 04/03/2017   Past Surgical History:  Procedure Laterality Date   FINGER SURGERY     AS A CHILD    TOOTH EXTRACTION     WITH SEDATION, NOT WISDOM TEETH   VASECTOMY  2004     Current Meds  Medication Sig   arginine 500 MG tablet Take 500 mg by mouth daily.   atorvastatin (LIPITOR) 10 MG tablet TAKE 1 TABLET BY MOUTH EVERY DAY   clonazePAM (KLONOPIN) 2 MG tablet Take 2 mg by mouth 3 (three) times daily as needed.    COVID-19 mRNA bivalent vaccine, Pfizer, (PFIZER COVID-19 VAC  BIVALENT) injection Inject into the muscle.   Multiple Vitamin (MULTIVITAMIN) tablet Take 1 tablet by mouth daily.   psyllium (METAMUCIL) 58.6 % powder 1 teaspoon once daily   sildenafil (REVATIO) 20 MG tablet TAKE 3-4 TABLETS BY MOUTH AT BEDTIME AS NEEDED. PRIOR AUTH DENIED. PLACED ON DISCOUNT CARD.   VYVANSE 50 MG capsule Take 50 mg by mouth every morning.   WELLBUTRIN XL 150 MG 24 hr tablet Take 150 mg by mouth every morning.     Allergies:   Patient has no known allergies.   Social History   Tobacco Use   Smoking status: Never   Smokeless tobacco: Never  Vaping Use   Vaping Use: Never used  Substance Use Topics   Alcohol use: Yes    Alcohol/week: 3.0 standard drinks    Types: 3 Glasses of wine per week    Comment: socially    Drug use: No     Family Hx: The patient's family history includes Atrial fibrillation in his father; Breast cancer in his mother; Colon cancer in his paternal grandfather, paternal grandmother, and another family member; Colon polyps in his father; Heart disease in his mother; Mitral valve prolapse in his mother; Stroke in his father. There is no history of Cancer - Prostate, Diabetes, Esophageal cancer, Rectal cancer, or Stomach cancer.  ROS:   Please see the history of present illness.     All other systems reviewed and are negative.   Labs/Other Tests and Data Reviewed:    Recent Labs: 10/03/2020: ALT 37; BUN 20; Creatinine, Ser 1.01; Hemoglobin 14.0; Platelets 176.0; Potassium 4.1; Sodium 141; TSH 2.64   Recent Lipid Panel Lab Results  Component Value Date/Time   CHOL 223 (H) 10/03/2020 07:20 AM   TRIG 110.0 10/03/2020 07:20 AM   HDL 69.30 10/03/2020 07:20 AM   CHOLHDL 3 10/03/2020 07:20 AM   LDLCALC 132 (H) 10/03/2020 07:20 AM   LDLDIRECT 171.4 10/08/2012 11:24 AM    Wt Readings from Last 3 Encounters:  06/12/21 194 lb (88 kg)  05/17/21 196 lb (88.9 kg)  12/15/20 192 lb (87.1 kg)     Objective:    Vital Signs:  Pulse (!) 58   Ht  6\' 1"  (1.854 m)   Wt 194 lb (88 kg)   BMI 25.60 kg/m   Well nourished, well developed male in no acute distress. Well appearing, alert and conversant, regular work of breathing,  good skin color  Eyes- anicteric mouth- oral mucosa is pink  neuro- grossly  intact skin- no apparent rash or lesions or cyanosis   ASSESSMENT & PLAN:    1.  OSA - The patient is tolerating PAP therapy well without any problems. The PAP download performed by his DME was personally reviewed and interpreted by me today and showed an AHI of 0.4/hr on auto CPAP with 83% compliance in using more than 4 hours nightly.  The patient has been using and benefiting from PAP use and will continue to benefit from therapy.  -he would like to try a set pressure instead of auto as sometimes he wakes up feeling like there is not enough pressure -will change him from auto CPAP to CPAP at 8cm H2O and get a download in 4 weeks.    2.  Excessive daytime sleepiness -this has resolved on PAP therapy -continue on CPAP  3.  HTN -he is currently not on any BP meds  4.  HLD -LDL goal < 100 -followed by PCP -continue prescription drug management with atorvastatin 10mg  daily with PRN refills -I will get a coronary Ca score to assess future cardiac risk  5.  Family hx of DCM -I will get a 2D echo to assess  COVID-19 Education: He is vaccinated fully  Patient Risk:   After full review of this patient's clinical status, I feel that they are at least moderate risk at this time.  Time:   Today, I have spent 20 minutes on telephone discussing medical problems including snoring , excessive daytime sleepiness and reviewing patient's chart including home sleep study and PAP compliance download.   Medication Adjustments/Labs and Tests Ordered: Current medicines are reviewed at length with the patient today.  Concerns regarding medicines are outlined above.  Tests Ordered: No orders of the defined types were placed in this  encounter.  Medication Changes: No orders of the defined types were placed in this encounter.   Disposition:  Follow up in 1 year in office  Signed, Fransico Him, MD  06/12/2021 8:18 AM    [place]

## 2021-06-12 NOTE — Addendum Note (Signed)
Addended by: Antonieta Iba on: 06/12/2021 08:42 AM   Modules accepted: Orders

## 2021-06-12 NOTE — Patient Instructions (Signed)
Medication Instructions:  Your physician recommends that you continue on your current medications as directed. Please refer to the Current Medication list given to you today.  *If you need a refill on your cardiac medications before your next appointment, please call your pharmacy*  Testing/Procedures: Your physician has requested that you have an echocardiogram. Echocardiography is a painless test that uses sound waves to create images of your heart. It provides your doctor with information about the size and shape of your heart and how well your heart's chambers and valves are working. This procedure takes approximately one hour. There are no restrictions for this procedure.  Your physician has requested that you have a calcium score CT scan.    Follow-Up: At [facility], you and your health needs are our priority.  As part of our continuing mission to provide you with exceptional heart care, we have created designated Provider Care Teams.  These Care Teams include your primary Cardiologist (physician) and Advanced Practice Providers (APPs -  Physician Assistants and Nurse Practitioners) who all work together to provide you with the care you need, when you need it.  Your next appointment:   1 year(s)  The format for your next appointment:   In Person  Provider:   Fransico Him, MD  Other Instructions Dr. Radford Pax is changing you from auto CPAP to CPAP at 8cm H2O

## 2021-06-14 ENCOUNTER — Telehealth: Payer: Self-pay | Admitting: *Deleted

## 2021-06-14 DIAGNOSIS — G4733 Obstructive sleep apnea (adult) (pediatric): Secondary | ICD-10-CM

## 2021-06-14 NOTE — Telephone Encounter (Signed)
Order placed to [facility] via community message. 

## 2021-06-14 NOTE — Telephone Encounter (Signed)
-----   Message from Troy Leonard, [place] sent at 06/12/2021  8:25 AM EST ----- Please change auto CPAP to CPAP at 8cm H2O and get a download in 4 weeks

## 2021-06-20 DIAGNOSIS — F900 Attention-deficit hyperactivity disorder, predominantly inattentive type: Secondary | ICD-10-CM | POA: Diagnosis not present

## 2021-06-20 DIAGNOSIS — F1010 Alcohol abuse, uncomplicated: Secondary | ICD-10-CM | POA: Diagnosis not present

## 2021-06-20 DIAGNOSIS — F411 Generalized anxiety disorder: Secondary | ICD-10-CM | POA: Diagnosis not present

## 2021-07-04 ENCOUNTER — Ambulatory Visit (HOSPITAL_COMMUNITY): Payer: BC Managed Care – PPO | Attending: Cardiology

## 2021-07-04 ENCOUNTER — Ambulatory Visit
Admission: RE | Admit: 2021-07-04 | Discharge: 2021-07-04 | Disposition: A | Discharge status: Home / Self Care | Payer: Self-pay | Source: Ambulatory Visit | Attending: Cardiology | Admitting: Cardiology

## 2021-07-04 ENCOUNTER — Other Ambulatory Visit: Payer: Self-pay | Admitting: Internal Medicine

## 2021-07-04 ENCOUNTER — Other Ambulatory Visit: Payer: Self-pay

## 2021-07-04 DIAGNOSIS — Z8249 Family history of ischemic heart disease and other diseases of the circulatory system: Secondary | ICD-10-CM | POA: Diagnosis not present

## 2021-07-04 DIAGNOSIS — I10 Essential (primary) hypertension: Secondary | ICD-10-CM | POA: Diagnosis not present

## 2021-07-04 DIAGNOSIS — G4719 Other hypersomnia: Secondary | ICD-10-CM | POA: Insufficient documentation

## 2021-07-04 DIAGNOSIS — G4733 Obstructive sleep apnea (adult) (pediatric): Secondary | ICD-10-CM | POA: Insufficient documentation

## 2021-07-04 DIAGNOSIS — E7800 Pure hypercholesterolemia, unspecified: Secondary | ICD-10-CM | POA: Insufficient documentation

## 2021-07-04 LAB — ECHOCARDIOGRAM COMPLETE
Area-P 1/2: 2.31 cm2
P 1/2 time: 606 msec
S' Lateral: 3.6 cm

## 2021-07-05 DIAGNOSIS — F1010 Alcohol abuse, uncomplicated: Secondary | ICD-10-CM | POA: Diagnosis not present

## 2021-07-05 DIAGNOSIS — F411 Generalized anxiety disorder: Secondary | ICD-10-CM | POA: Diagnosis not present

## 2021-07-05 DIAGNOSIS — F900 Attention-deficit hyperactivity disorder, predominantly inattentive type: Secondary | ICD-10-CM | POA: Diagnosis not present

## 2021-07-13 ENCOUNTER — Telehealth: Payer: Self-pay

## 2021-07-13 DIAGNOSIS — E7800 Pure hypercholesterolemia, unspecified: Secondary | ICD-10-CM

## 2021-07-13 NOTE — Telephone Encounter (Signed)
-----   Message from Loren Racer, RN sent at 07/13/2021  9:00 AM EST ----- Freada Bergeron, MD  Loren Racer, RN; Sueanne Margarita, MD Cc: Werner Lean, MD Thank you for following up. Can we increase the lipitor to 20mg  daily? His goal LDL is <70 given his calcium score. We can repeat lipids in 6-8 weeks and if not at goal ,will increase to 40mg  daily if he is tolerating the higher dose.

## 2021-07-16 ENCOUNTER — Other Ambulatory Visit (HOSPITAL_BASED_OUTPATIENT_CLINIC_OR_DEPARTMENT_OTHER): Payer: Self-pay

## 2021-07-17 NOTE — Telephone Encounter (Signed)
Ma Hillock, RN  07/13/2021 12:29 PM EST     Attempted to call patient, no answer. Left VM to call back 12/30 Judson Roch, RN

## 2021-07-18 ENCOUNTER — Encounter: Payer: Self-pay | Admitting: Internal Medicine

## 2021-07-18 DIAGNOSIS — Z1283 Encounter for screening for malignant neoplasm of skin: Secondary | ICD-10-CM

## 2021-07-19 MED ORDER — ATORVASTATIN CALCIUM 20 MG PO TABS: 20.0000 mg | ORAL_TABLET | Freq: Every day | ORAL | 3 refills | Status: DC

## 2021-07-19 NOTE — Telephone Encounter (Signed)
Spoke with the patient and advised him to increase atorvastatin to 20 mg daily. Patient verbalized understanding. Lab work has been scheduled.

## 2021-07-19 NOTE — Addendum Note (Signed)
Addended by: Antonieta Iba on: 07/19/2021 03:44 PM   Modules accepted: Orders

## 2021-07-26 DIAGNOSIS — F411 Generalized anxiety disorder: Secondary | ICD-10-CM | POA: Diagnosis not present

## 2021-07-26 DIAGNOSIS — F900 Attention-deficit hyperactivity disorder, predominantly inattentive type: Secondary | ICD-10-CM | POA: Diagnosis not present

## 2021-07-26 DIAGNOSIS — F1010 Alcohol abuse, uncomplicated: Secondary | ICD-10-CM | POA: Diagnosis not present

## 2021-07-30 DIAGNOSIS — G4733 Obstructive sleep apnea (adult) (pediatric): Secondary | ICD-10-CM | POA: Diagnosis not present

## 2021-07-31 ENCOUNTER — Other Ambulatory Visit: Payer: Self-pay | Admitting: Internal Medicine

## 2021-08-08 DIAGNOSIS — F411 Generalized anxiety disorder: Secondary | ICD-10-CM | POA: Diagnosis not present

## 2021-08-08 DIAGNOSIS — F1010 Alcohol abuse, uncomplicated: Secondary | ICD-10-CM | POA: Diagnosis not present

## 2021-08-08 DIAGNOSIS — F900 Attention-deficit hyperactivity disorder, predominantly inattentive type: Secondary | ICD-10-CM | POA: Diagnosis not present

## 2021-08-20 DIAGNOSIS — F1010 Alcohol abuse, uncomplicated: Secondary | ICD-10-CM | POA: Diagnosis not present

## 2021-08-20 DIAGNOSIS — F411 Generalized anxiety disorder: Secondary | ICD-10-CM | POA: Diagnosis not present

## 2021-08-20 DIAGNOSIS — F900 Attention-deficit hyperactivity disorder, predominantly inattentive type: Secondary | ICD-10-CM | POA: Diagnosis not present

## 2021-08-30 DIAGNOSIS — G4733 Obstructive sleep apnea (adult) (pediatric): Secondary | ICD-10-CM | POA: Diagnosis not present

## 2021-09-04 DIAGNOSIS — F411 Generalized anxiety disorder: Secondary | ICD-10-CM | POA: Diagnosis not present

## 2021-09-04 DIAGNOSIS — F900 Attention-deficit hyperactivity disorder, predominantly inattentive type: Secondary | ICD-10-CM | POA: Diagnosis not present

## 2021-09-04 DIAGNOSIS — F1010 Alcohol abuse, uncomplicated: Secondary | ICD-10-CM | POA: Diagnosis not present

## 2021-09-07 ENCOUNTER — Other Ambulatory Visit: Payer: BC Managed Care – PPO | Admitting: *Deleted

## 2021-09-07 ENCOUNTER — Other Ambulatory Visit: Payer: BC Managed Care – PPO

## 2021-09-07 ENCOUNTER — Other Ambulatory Visit: Payer: Self-pay

## 2021-09-07 DIAGNOSIS — E7800 Pure hypercholesterolemia, unspecified: Secondary | ICD-10-CM | POA: Diagnosis not present

## 2021-09-08 LAB — LIPID PANEL
Chol/HDL Ratio: 2.6 ratio (ref 0.0–5.0)
Cholesterol, Total: 218 mg/dL — ABNORMAL HIGH (ref 100–199)
HDL: 83 mg/dL (ref >39)
LDL Chol Calc (NIH): 113 mg/dL — ABNORMAL HIGH (ref 0–99)
Triglycerides: 129 mg/dL (ref 0–149)
VLDL Cholesterol Cal: 22 mg/dL (ref 5–40)

## 2021-09-08 LAB — ALT: ALT: 30 [IU]/L (ref 0–44)

## 2021-09-12 ENCOUNTER — Telehealth: Payer: Self-pay | Admitting: Cardiology

## 2021-09-12 ENCOUNTER — Telehealth: Payer: Self-pay

## 2021-09-12 DIAGNOSIS — E7800 Pure hypercholesterolemia, unspecified: Secondary | ICD-10-CM

## 2021-09-12 MED ORDER — ATORVASTATIN CALCIUM 40 MG PO TABS: 40.0000 mg | ORAL_TABLET | Freq: Every day | ORAL | 3 refills | Status: DC

## 2021-09-12 NOTE — Telephone Encounter (Signed)
The patient has been notified of the result and verbalized understanding.  All questions (if any) were answered. ?Antonieta Iba, RN 09/12/2021 3:53 PM  ? ?

## 2021-09-12 NOTE — Telephone Encounter (Signed)
-----   Message from Sueanne Margarita, MD sent at 09/09/2021 12:16 PM EST ----- ?LDL goal < 70 so not at goal.  Increase Atorvastatin 40mg  daily and repeat FLP and ALT in 6 weeks ?

## 2021-09-12 NOTE — Telephone Encounter (Signed)
Patient returning call for lab results. 

## 2021-09-12 NOTE — Telephone Encounter (Signed)
See above note

## 2021-09-28 DIAGNOSIS — F411 Generalized anxiety disorder: Secondary | ICD-10-CM | POA: Diagnosis not present

## 2021-09-28 DIAGNOSIS — F1010 Alcohol abuse, uncomplicated: Secondary | ICD-10-CM | POA: Diagnosis not present

## 2021-09-28 DIAGNOSIS — F900 Attention-deficit hyperactivity disorder, predominantly inattentive type: Secondary | ICD-10-CM | POA: Diagnosis not present

## 2021-10-05 ENCOUNTER — Encounter: Payer: Self-pay | Admitting: Internal Medicine

## 2021-10-05 ENCOUNTER — Ambulatory Visit (INDEPENDENT_AMBULATORY_CARE_PROVIDER_SITE_OTHER): Payer: BC Managed Care – PPO | Admitting: Internal Medicine

## 2021-10-05 VITALS — BP 126/72 | HR 92 | Temp 97.6°F | Resp 16 | Ht 73.0 in | Wt 198.1 lb

## 2021-10-05 DIAGNOSIS — Z0000 Encounter for general adult medical examination without abnormal findings: Secondary | ICD-10-CM | POA: Diagnosis not present

## 2021-10-05 DIAGNOSIS — E785 Hyperlipidemia, unspecified: Secondary | ICD-10-CM

## 2021-10-05 DIAGNOSIS — I10 Essential (primary) hypertension: Secondary | ICD-10-CM

## 2021-10-05 LAB — COMPREHENSIVE METABOLIC PANEL
ALT: 29 U/L (ref 0–53)
AST: 25 U/L (ref 0–37)
Albumin: 5 g/dL (ref 3.5–5.2)
Alkaline Phosphatase: 69 U/L (ref 39–117)
BUN: 16 mg/dL (ref 6–23)
CO2: 31 mEq/L (ref 19–32)
Calcium: 9.3 mg/dL (ref 8.4–10.5)
Chloride: 104 mEq/L (ref 96–112)
Creatinine, Ser: 0.96 mg/dL (ref 0.40–1.50)
GFR: 92.27 mL/min (ref >60.00)
Glucose, Bld: 92 mg/dL (ref 70–99)
Potassium: 4 mEq/L (ref 3.5–5.1)
Sodium: 142 mEq/L (ref 135–145)
Total Bilirubin: 0.5 mg/dL (ref 0.2–1.2)
Total Protein: 7 g/dL (ref 6.0–8.3)

## 2021-10-05 LAB — CBC WITH DIFFERENTIAL/PLATELET
Basophils Absolute: 0 10*3/uL (ref 0.0–0.1)
Basophils Relative: 0.7 % (ref 0.0–3.0)
Eosinophils Absolute: 0.1 10*3/uL (ref 0.0–0.7)
Eosinophils Relative: 1.3 % (ref 0.0–5.0)
HCT: 42.8 % (ref 39.0–52.0)
Hemoglobin: 14.6 g/dL (ref 13.0–17.0)
Lymphocytes Relative: 29.6 % (ref 12.0–46.0)
Lymphs Abs: 1.6 10*3/uL (ref 0.7–4.0)
MCHC: 34 g/dL (ref 30.0–36.0)
MCV: 92.1 fl (ref 78.0–100.0)
Monocytes Absolute: 0.5 10*3/uL (ref 0.1–1.0)
Monocytes Relative: 8.6 % (ref 3.0–12.0)
Neutro Abs: 3.3 10*3/uL (ref 1.4–7.7)
Neutrophils Relative %: 59.8 % (ref 43.0–77.0)
Platelets: 197 10*3/uL (ref 150.0–400.0)
RBC: 4.65 Mil/uL (ref 4.22–5.81)
RDW: 12.4 % (ref 11.5–15.5)
WBC: 5.5 10*3/uL (ref 4.0–10.5)

## 2021-10-05 LAB — LIPID PANEL
Cholesterol: 197 mg/dL (ref 0–200)
HDL: 75.4 mg/dL (ref >39.00)
LDL Cholesterol: 104 mg/dL — ABNORMAL HIGH (ref 0–99)
NonHDL: 121.61
Total CHOL/HDL Ratio: 3
Triglycerides: 88 mg/dL (ref 0.0–149.0)
VLDL: 17.6 mg/dL (ref 0.0–40.0)

## 2021-10-05 NOTE — Patient Instructions (Signed)
Check the  blood pressure regularly ?BP GOAL is between 110/65 and  135/85. ?If it is consistently higher or lower, let me know ? ?A shingles shot is advisable ? ?GO TO THE LAB : Get the blood work   ? ? ?Troy Leonard, [place] ?Come back for a physical exam in 1 year ?

## 2021-10-05 NOTE — Assessment & Plan Note (Signed)
Here for CPX ?HTN: BP is very good, on no medications. ?Hyperlipidemia: FH CAD, calcium coronary score recently was 49, last LDL 113, atorvastatin increased to 40 mg, goal less than 70 LDL.  Labs.  Further advised with results. ?Anxiety, ADD: Follow-up elsewhere ?RTC 1 year. ?

## 2021-10-05 NOTE — Progress Notes (Signed)
? ?Subjective:  ? ? Patient ID: Troy Leonard, male    DOB: Jan 11, 1971, 51 y.o.   MRN: [id number] ? ?DOS:  10/05/2021 ?Type of visit - description: cpx ? ?Since the last office visit is doing well.  Has no major concerns. ? ?Review of Systems ? ? ?A 14 point review of systems is negative  ? ? ?Past Medical History:  ?Diagnosis Date  ? ABNORMAL ECHOCARDIOGRAM 06/15/2009  ? Qualifier: Diagnosis of  By: Angelena Form, MD, [place] Gave    ? Abnormal EKG   ? LVH, iRBB, sees cards   ? Anxiety   ? sees Dr Caprice Beaver  ? ANXIETY 05/19/2008  ? Qualifier: Diagnosis of  By: Larose Kells MD, [place] Backache 04/06/2008  ? Qualifier: Diagnosis of  By: Larose Kells MD, [place] Benign neoplasm of skin 06/05/2009  ? Qualifier: Diagnosis of  By: Larose Kells MD, [place] Dyslipidemia 10/08/2012  ? Epididymal cyst 05/20/2011  ? Essential hypertension 05/19/2008  ? Qualifier: Diagnosis of  By: Larose Kells MD, [place] Hyperlipidemia   ? Hypertension   ? Multiple pigmented nevi   ? saw derm before   ? RBBB 06/15/2009  ? Qualifier: Diagnosis of  By: Earley Favor RN, BSN, Leonie Man   ? Sleep apnea   ? WEARS CPAP   ? Throat pain 04/03/2017  ? ? ?Past Surgical History:  ?Procedure Laterality Date  ? FINGER SURGERY    ? AS A CHILD   ? TOOTH EXTRACTION    ? WITH SEDATION, NOT WISDOM TEETH  ? VASECTOMY  2004  ? ?Social History  ? ?Socioeconomic History  ? Marital status: Married  ?  Spouse name: Not on file  ? Number of children: 2  ? Years of education: Not on file  ? Highest education level: Not on file  ?Occupational History  ? Occupation: works for a Doctor, general practice  ?  Employer: [employer]  ?Tobacco Use  ? Smoking status: Never  ? Smokeless tobacco: Never  ?Vaping Use  ? Vaping Use: Never used  ?Substance and Sexual Activity  ? Alcohol use: Yes  ?  Alcohol/week: 3.0 standard drinks  ?  Types: 3 Glasses of wine per week  ?  Comment: socially   ? Drug use: No  ? Sexual activity: Yes  ?Other Topics Concern  ? Not on file  ?Social History Narrative  ? Household: pt wife   ? 2 children:  2001 2003 both in [place]  ?    ? ?Social Determinants of Health  ? ?Financial Resource Strain: Not on file  ?Food Insecurity: Not on file  ?Transportation Needs: Not on file  ?Physical Activity: Not on file  ?Stress: Not on file  ?Social Connections: Not on file  ?Intimate Partner Violence: Not on file  ? ? ?Current Outpatient Medications  ?Medication Instructions  ? arginine 500 mg, Oral, Daily  ? atorvastatin (LIPITOR) 40 mg, Oral, Daily  ? clonazePAM (KLONOPIN) 2 mg, Oral, 3 times daily PRN  ? Multiple Vitamin (MULTIVITAMIN) tablet 1 tablet, Oral, Daily,    ? psyllium (METAMUCIL) 58.6 % powder 1 teaspoon once daily   ? sildenafil (REVATIO) 20 MG tablet TAKE 3-4 TABLETS BY MOUTH AT BEDTIME AS NEEDED. PRIOR AUTH DENIED. PLACED ON DISCOUNT CARD.  ? Vyvanse 50 mg, Oral, Every morning  ? Wellbutrin XL 150 mg, Oral, Every morning  ? ? ?   ?Objective:  ? Physical Exam ?BP 126/72 (BP Location: Left Arm,  Patient Position: Sitting, Cuff Size: Small)   Pulse 92   Temp 97.6 ?F (36.4 ?C) (Oral)   Resp 16   Ht '6\' 1"'$  (1.854 m)   Wt 198 lb 2 oz (89.9 kg)   SpO2 97%   BMI 26.14 kg/m?  ?General: ?Well developed, NAD, BMI noted ?Neck: No  thyromegaly  ?HEENT:  ?Normocephalic . Face symmetric, atraumatic ?Lungs:  ?CTA B ?Normal respiratory effort, no intercostal retractions, no accessory muscle use. ?Heart: RRR,  no murmur.  ?Abdomen:  ?Not distended, soft, non-tender. No rebound or rigidity.   ?Lower extremities: no pretibial edema bilaterally  ?Skin: Exposed areas without rash. Not pale. Not jaundice ?DRE: Normal sphincter tone, normal prostate.  No stool ?Neurologic:  ?alert & oriented X3.  ?Speech normal, gait appropriate for age and unassisted ?Strength symmetric and appropriate for age.  ?Psych: ?Cognition and judgment appear intact.  ?Cooperative with normal attention span and concentration.  ?Behavior appropriate. ?No anxious or depressed appearing. ? ?   ?Assessment   ? ? 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 Hoover Browns (clonazepam) ?Multiple nevus, sees derm ? ? ?PLAN: ?Here for CPX ?HTN: BP is very good, on no medications. ?Hyperlipidemia: FH CAD, calcium coronary score recently was 49, last LDL 113, atorvastatin increased to 40 mg, goal less than 70 LDL.  Labs.  Further advised with results. ?Anxiety, ADD: Follow-up elsewhere ?RTC 1 year. ? ? ? ?This visit occurred during the SARS-CoV-2 public health emergency.  Safety protocols were in place, including screening questions prior to the visit, additional usage of staff PPE, and extensive cleaning of exam room while observing appropriate contact time as indicated for disinfecting solutions.  ? ?

## 2021-10-05 NOTE — Assessment & Plan Note (Signed)
--  Td: 2022 ?- Shingrix: Discussed ?-COVID VAX : utd ?--CCS: cscope 11-2020 ?-Prostate cancer screening: No symptoms, DRE normal, check PSA ?- Diet exercise: Counseled  ?--Labs: CMP, FLP, CBC, PSA ?  ?

## 2021-10-09 LAB — PSA: PSA: 0.77 ng/mL (ref 0.10–4.00)

## 2021-10-10 MED ORDER — EZETIMIBE 10 MG PO TABS: 10.0000 mg | ORAL_TABLET | Freq: Every day | ORAL | 3 refills | Status: DC

## 2021-10-10 NOTE — Addendum Note (Signed)
Addended byDamita Dunnings D on: 10/10/2021 01:05 PM ? ? Modules accepted: Orders ? ?

## 2021-11-01 DIAGNOSIS — G4733 Obstructive sleep apnea (adult) (pediatric): Secondary | ICD-10-CM | POA: Diagnosis not present

## 2021-11-02 ENCOUNTER — Other Ambulatory Visit: Payer: BC Managed Care – PPO

## 2021-11-07 DIAGNOSIS — F1010 Alcohol abuse, uncomplicated: Secondary | ICD-10-CM | POA: Diagnosis not present

## 2021-11-07 DIAGNOSIS — F411 Generalized anxiety disorder: Secondary | ICD-10-CM | POA: Diagnosis not present

## 2021-11-07 DIAGNOSIS — F900 Attention-deficit hyperactivity disorder, predominantly inattentive type: Secondary | ICD-10-CM | POA: Diagnosis not present

## 2021-11-09 ENCOUNTER — Other Ambulatory Visit: Payer: BC Managed Care – PPO | Admitting: *Deleted

## 2021-11-09 DIAGNOSIS — E7800 Pure hypercholesterolemia, unspecified: Secondary | ICD-10-CM

## 2021-11-09 LAB — LIPID PANEL
Chol/HDL Ratio: 1.8 ratio (ref 0.0–5.0)
Cholesterol, Total: 175 mg/dL (ref 100–199)
HDL: 96 mg/dL (ref >39)
LDL Chol Calc (NIH): 66 mg/dL (ref 0–99)
Triglycerides: 66 mg/dL (ref 0–149)
VLDL Cholesterol Cal: 13 mg/dL (ref 5–40)

## 2021-11-09 LAB — ALT: ALT: 45 IU/L — ABNORMAL HIGH (ref 0–44)

## 2021-11-22 DIAGNOSIS — F411 Generalized anxiety disorder: Secondary | ICD-10-CM | POA: Diagnosis not present

## 2021-11-22 DIAGNOSIS — F1010 Alcohol abuse, uncomplicated: Secondary | ICD-10-CM | POA: Diagnosis not present

## 2021-11-22 DIAGNOSIS — F900 Attention-deficit hyperactivity disorder, predominantly inattentive type: Secondary | ICD-10-CM | POA: Diagnosis not present

## 2021-12-01 DIAGNOSIS — G4733 Obstructive sleep apnea (adult) (pediatric): Secondary | ICD-10-CM | POA: Diagnosis not present

## 2021-12-03 DIAGNOSIS — G4733 Obstructive sleep apnea (adult) (pediatric): Secondary | ICD-10-CM | POA: Diagnosis not present

## 2021-12-07 DIAGNOSIS — F900 Attention-deficit hyperactivity disorder, predominantly inattentive type: Secondary | ICD-10-CM | POA: Diagnosis not present

## 2021-12-07 DIAGNOSIS — F411 Generalized anxiety disorder: Secondary | ICD-10-CM | POA: Diagnosis not present

## 2021-12-07 DIAGNOSIS — F1010 Alcohol abuse, uncomplicated: Secondary | ICD-10-CM | POA: Diagnosis not present

## 2021-12-13 DIAGNOSIS — F900 Attention-deficit hyperactivity disorder, predominantly inattentive type: Secondary | ICD-10-CM | POA: Diagnosis not present

## 2021-12-13 DIAGNOSIS — F1010 Alcohol abuse, uncomplicated: Secondary | ICD-10-CM | POA: Diagnosis not present

## 2021-12-13 DIAGNOSIS — F411 Generalized anxiety disorder: Secondary | ICD-10-CM | POA: Diagnosis not present

## 2021-12-31 DIAGNOSIS — F1010 Alcohol abuse, uncomplicated: Secondary | ICD-10-CM | POA: Diagnosis not present

## 2021-12-31 DIAGNOSIS — F900 Attention-deficit hyperactivity disorder, predominantly inattentive type: Secondary | ICD-10-CM | POA: Diagnosis not present

## 2021-12-31 DIAGNOSIS — F411 Generalized anxiety disorder: Secondary | ICD-10-CM | POA: Diagnosis not present

## 2022-01-01 DIAGNOSIS — G4733 Obstructive sleep apnea (adult) (pediatric): Secondary | ICD-10-CM | POA: Diagnosis not present

## 2022-01-03 DIAGNOSIS — G4733 Obstructive sleep apnea (adult) (pediatric): Secondary | ICD-10-CM | POA: Diagnosis not present

## 2022-01-18 DIAGNOSIS — F1010 Alcohol abuse, uncomplicated: Secondary | ICD-10-CM | POA: Diagnosis not present

## 2022-01-18 DIAGNOSIS — F900 Attention-deficit hyperactivity disorder, predominantly inattentive type: Secondary | ICD-10-CM | POA: Diagnosis not present

## 2022-01-18 DIAGNOSIS — F411 Generalized anxiety disorder: Secondary | ICD-10-CM | POA: Diagnosis not present

## 2022-02-25 DIAGNOSIS — F900 Attention-deficit hyperactivity disorder, predominantly inattentive type: Secondary | ICD-10-CM | POA: Diagnosis not present

## 2022-02-25 DIAGNOSIS — F411 Generalized anxiety disorder: Secondary | ICD-10-CM | POA: Diagnosis not present

## 2022-03-04 DIAGNOSIS — G4733 Obstructive sleep apnea (adult) (pediatric): Secondary | ICD-10-CM | POA: Diagnosis not present

## 2022-04-04 DIAGNOSIS — G4733 Obstructive sleep apnea (adult) (pediatric): Secondary | ICD-10-CM | POA: Diagnosis not present

## 2022-04-05 DIAGNOSIS — F1010 Alcohol abuse, uncomplicated: Secondary | ICD-10-CM | POA: Diagnosis not present

## 2022-04-05 DIAGNOSIS — F411 Generalized anxiety disorder: Secondary | ICD-10-CM | POA: Diagnosis not present

## 2022-04-05 DIAGNOSIS — F900 Attention-deficit hyperactivity disorder, predominantly inattentive type: Secondary | ICD-10-CM | POA: Diagnosis not present

## 2022-04-26 DIAGNOSIS — F900 Attention-deficit hyperactivity disorder, predominantly inattentive type: Secondary | ICD-10-CM | POA: Diagnosis not present

## 2022-04-26 DIAGNOSIS — F1010 Alcohol abuse, uncomplicated: Secondary | ICD-10-CM | POA: Diagnosis not present

## 2022-04-26 DIAGNOSIS — F411 Generalized anxiety disorder: Secondary | ICD-10-CM | POA: Diagnosis not present

## 2022-05-04 DIAGNOSIS — G4733 Obstructive sleep apnea (adult) (pediatric): Secondary | ICD-10-CM | POA: Diagnosis not present

## 2022-05-08 ENCOUNTER — Other Ambulatory Visit: Payer: Self-pay | Admitting: Cardiology

## 2022-05-08 NOTE — Progress Notes (Unsigned)
Cardiology Office Note:    Date:  05/09/2022   ID:  Troy Leonard, DOB Nov 12, 1970, MRN [id number]  PCP:  Colon Branch, MD  [facility] Cardiologist:  Lauree Chandler, MD  [facility] Electrophysiologist:  None  Sleep Medicine:   Fransico Him, MD  Chief Complaint: 2 yr follow up   History of Present Illness:    Troy Leonard is a 51 y.o. male with a hx of of HTN, HLD, OSA on CPAP and RBBB seen for follow up.   Echocardiogram December 2022 with LV function of 60 to 65%, no regional wall motion abnormality.  CT cardiac scoring with coronary calcium score of 49. This was 82 percentile.  Here today for follow up.  Patient states that he is no longer on antihypertensive regimen for many years.  He used to take benazepril on review of prior chart.  Denies chest pain, shortness of breath, orthopnea, PND, syncope, lower extremity edema, palpitation or dizziness.  He used do cardio exercise but recently slacked back.  Compliant with CPAP.   Past Medical History:  Diagnosis Date   ABNORMAL ECHOCARDIOGRAM 06/15/2009   Qualifier: Diagnosis of  By: Angelena Form, MD, [place]     Abnormal EKG    LVH, iRBB, sees cards    Anxiety    sees Dr Caprice Beaver   ANXIETY 05/19/2008   Qualifier: Diagnosis of  By: Larose Kells MD, [place] 04/06/2008   Qualifier: Diagnosis of  By: Larose Kells MD, [place]    Benign neoplasm of skin 06/05/2009   Qualifier: Diagnosis of  By: Larose Kells MD, [place]    Dyslipidemia 10/08/2012   Epididymal cyst 05/20/2011   Essential hypertension 05/19/2008   Qualifier: Diagnosis of  By: Larose Kells MD, [place]    Hyperlipidemia    Hypertension    Multiple pigmented nevi    saw derm before    RBBB 06/15/2009   Qualifier: Diagnosis of  By: Earley Favor, RN, BSN, Leonie Man    Sleep apnea    WEARS CPAP    Throat pain 04/03/2017    Past Surgical History:  Procedure Laterality Date   FINGER SURGERY     AS A CHILD    TOOTH EXTRACTION     WITH SEDATION, NOT WISDOM TEETH   VASECTOMY  2004     Current Medications: Current Meds  Medication Sig   atorvastatin (LIPITOR) 40 MG tablet Take 1 tablet (40 mg total) by mouth daily.   clonazePAM (KLONOPIN) 2 MG tablet Take 2 mg by mouth 3 (three) times daily as needed.    ezetimibe (ZETIA) 10 MG tablet Take 1 tablet (10 mg total) by mouth daily.   Multiple Vitamin (MULTIVITAMIN) tablet Take 1 tablet by mouth daily.   psyllium (METAMUCIL) 58.6 % powder 1 teaspoon once daily   sildenafil (REVATIO) 20 MG tablet TAKE 3-4 TABLETS BY MOUTH AT BEDTIME AS NEEDED. PRIOR AUTH DENIED. PLACED ON DISCOUNT CARD.   VYVANSE 50 MG capsule Take 50 mg by mouth every morning.     Allergies:   Patient has no known allergies.   Social History   Socioeconomic History   Marital status: Married    Spouse name: Not on file   Number of children: 2   Years of education: Not on file   Highest education level: Not on file  Occupational History   Occupation: works for a Environmental consultant: [employer]  Tobacco Use   Smoking status: Never   Smokeless  tobacco: Never  Vaping Use   Vaping Use: Never used  Substance and Sexual Activity   Alcohol use: Yes    Alcohol/week: 3.0 standard drinks of alcohol    Types: 3 Glasses of wine per week    Comment: socially    Drug use: No   Sexual activity: Yes  Other Topics Concern   Not on file  Social History Narrative   Household: pt wife    2 children: 2001 2003 both in [place] Determinants of Health   Financial Resource Strain: Not on file  Food Insecurity: Not on file  Transportation Needs: Not on file  Physical Activity: Not on file  Stress: Not on file  Social Connections: Not on file     Family History: The patient's family history includes Atrial fibrillation in his father; Breast cancer in his mother; Colon cancer in his paternal grandfather, paternal grandmother, and another family member; Colon polyps in his father; Heart disease in his mother; Mitral valve prolapse in his  mother; Stroke in his father. There is no history of Cancer - Prostate, Diabetes, Esophageal cancer, Rectal cancer, or Stomach cancer.   ROS:   Please see the history of present illness.    All other systems reviewed and are negative.   EKGs/Labs/Other Studies Reviewed:    The following studies were reviewed today:  CT cardiac scoring 06/2021 FINDINGS: Coronary arteries: Normal origins.   Coronary Calcium Score:   Left main: 0   Left anterior descending artery: 49   Left circumflex artery: 0   Right coronary artery: 0   Total: 49   Percentile: 82   Pericardium: Normal.   Ascending Aorta: Normal caliber.   Non-cardiac: See separate report from [facility].   IMPRESSION: Coronary calcium score of 49. This was 28 percentile for age-, race-, and sex-matched controls.  Echo 06/2021 IMPRESSIONS  1. Left ventricular ejection fraction, by estimation, is 60 to 65%. The  left ventricle has normal function. The left ventricle has no regional  wall motion abnormalities. The left ventricular internal cavity size was  mildly to moderately dilated. Left  ventricular diastolic parameters were normal.   2. Right ventricular systolic function is normal. The right ventricular  size is normal. There is normal pulmonary artery systolic pressure.   3. Left atrial size was moderately dilated.   4. Right atrial size was mildly dilated.   5. The mitral valve is normal in structure. Trivial mitral valve  regurgitation. No evidence of mitral stenosis.   6. The aortic valve is tricuspid. Aortic valve regurgitation is mild.   7. The inferior vena cava is normal in size with greater than 50%  respiratory variability, suggesting right atrial pressure of 3 mmHg.   Comparison(s): No significant change from prior study. Prior images  reviewed side by side.   Conclusion(s)/Recommendation(s): Otherwise normal echocardiogram, with  minor abnormalities described in the report.   EKG:   EKG is  ordered today.  The ekg ordered today demonstrates this rhythm, right bundle branch block  Recent Labs: 10/05/2021: BUN 16; Creatinine, Ser 0.96; Hemoglobin 14.6; Platelets 197.0; Potassium 4.0; Sodium 142 11/09/2021: ALT 45  Recent Lipid Panel    Component Value Date/Time   CHOL 175 11/09/2021 1006   TRIG 66 11/09/2021 1006   HDL 96 11/09/2021 1006   CHOLHDL 1.8 11/09/2021 1006   CHOLHDL 3 10/05/2021 0852   VLDL 17.6 10/05/2021 0852   LDLCALC 66 11/09/2021 1006   LDLDIRECT 171.4  10/08/2012 1124   Physical Exam:    VS:  BP 112/88   Pulse 97   Ht '6\' 1"'$  (1.854 m)   Wt 197 lb 9.6 oz (89.6 kg)   SpO2 96%   BMI 26.07 kg/m     Wt Readings from Last 3 Encounters:  05/09/22 197 lb 9.6 oz (89.6 kg)  10/05/21 198 lb 2 oz (89.9 kg)  06/12/21 194 lb (88 kg)     GEN:  Well nourished, well developed in no acute distress HEENT: Normal NECK: No JVD; No carotid bruits LYMPHATICS: No lymphadenopathy CARDIAC: RRR, no murmurs, rubs, gallops RESPIRATORY:  Clear to auscultation without rales, wheezing or rhonchi  ABDOMEN: Soft, non-tender, non-distended MUSCULOSKELETAL:  No edema; No deformity  SKIN: Warm and dry NEUROLOGIC:  Alert and oriented x 3 PSYCHIATRIC:  Normal affect   ASSESSMENT AND PLAN:    HTN No longer on antihypertensive.  Blood pressure stable.  Discussed to keep eye on diastolic blood pressure.  2. HLD .10/05/2021: VLDL 17.6 11/09/2021: Cholesterol, Total 175; HDL 96; LDL Chol Calc (NIH) 66; Triglycerides 66  -Continue Zetia and Lipitor.  3.  Elevated calcium score -Continue statin  4. OSA on CPAP - Compliant   Medication Adjustments/Labs and Tests Ordered: Current medicines are reviewed at length with the patient today.  Concerns regarding medicines are outlined above.  Orders Placed This Encounter  Procedures   EKG 12-Lead   No orders of the defined types were placed in this encounter.   Patient Instructions  Medication Instructions:  Your  physician recommends that you continue on your current medications as directed. Please refer to the Current Medication list given to you today. *If you need a refill on your cardiac medications before your next appointment, please call your pharmacy*   Lab Work: None Ordered   Testing/Procedures: None Ordered   Follow-Up: At [facility], you and your health needs are our priority.  As part of our continuing mission to provide you with exceptional heart care, we have created designated Provider Care Teams.  These Care Teams include your primary Cardiologist (physician) and Advanced Practice Providers (APPs -  Physician Assistants and Nurse Practitioners) who all work together to provide you with the care you need, when you need it.  We recommend signing up for the patient portal called "MyChart".  Sign up information is provided on this After Visit Summary.  MyChart is used to connect with patients for Virtual Visits (Telemedicine).  Patients are able to view lab/test results, encounter notes, upcoming appointments, etc.  Non-urgent messages can be sent to your provider as well.   To learn more about what you can do with MyChart, go to [email or web address].    Your next appointment:   1 year(s)  The format for your next appointment:   In Person  Provider:   Lauree Chandler, MD     Other Instructions   Important Information About Sugar         Jarrett Soho, [place]  05/09/2022 8:48 AM    [place]

## 2022-05-09 ENCOUNTER — Encounter: Payer: Self-pay | Admitting: Internal Medicine

## 2022-05-09 ENCOUNTER — Ambulatory Visit: Payer: BC Managed Care – PPO | Attending: Physician Assistant | Admitting: Physician Assistant

## 2022-05-09 ENCOUNTER — Encounter: Payer: Self-pay | Admitting: Physician Assistant

## 2022-05-09 VITALS — BP 112/88 | HR 97 | Ht 73.0 in | Wt 197.6 lb

## 2022-05-09 DIAGNOSIS — Z8249 Family history of ischemic heart disease and other diseases of the circulatory system: Secondary | ICD-10-CM

## 2022-05-09 DIAGNOSIS — I10 Essential (primary) hypertension: Secondary | ICD-10-CM | POA: Diagnosis not present

## 2022-05-09 DIAGNOSIS — G4733 Obstructive sleep apnea (adult) (pediatric): Secondary | ICD-10-CM | POA: Diagnosis not present

## 2022-05-09 DIAGNOSIS — E7800 Pure hypercholesterolemia, unspecified: Secondary | ICD-10-CM | POA: Diagnosis not present

## 2022-05-09 NOTE — Patient Instructions (Signed)
Medication Instructions:  Your physician recommends that you continue on your current medications as directed. Please refer to the Current Medication list given to you today. *If you need a refill on your cardiac medications before your next appointment, please call your pharmacy*   Lab Work: None Ordered   Testing/Procedures: None Ordered   Follow-Up: At [facility], you and your health needs are our priority.  As part of our continuing mission to provide you with exceptional heart care, we have created designated Provider Care Teams.  These Care Teams include your primary Cardiologist (physician) and Advanced Practice Providers (APPs -  Physician Assistants and Nurse Practitioners) who all work together to provide you with the care you need, when you need it.  We recommend signing up for the patient portal called "MyChart".  Sign up information is provided on this After Visit Summary.  MyChart is used to connect with patients for Virtual Visits (Telemedicine).  Patients are able to view lab/test results, encounter notes, upcoming appointments, etc.  Non-urgent messages can be sent to your provider as well.   To learn more about what you can do with MyChart, go to [email or web address].    Your next appointment:   1 year(s)  The format for your next appointment:   In Person  Provider:   Lauree Chandler, MD     Other Instructions   Important Information About Sugar

## 2022-05-10 DIAGNOSIS — F411 Generalized anxiety disorder: Secondary | ICD-10-CM | POA: Diagnosis not present

## 2022-05-10 DIAGNOSIS — F900 Attention-deficit hyperactivity disorder, predominantly inattentive type: Secondary | ICD-10-CM | POA: Diagnosis not present

## 2022-05-10 DIAGNOSIS — F1010 Alcohol abuse, uncomplicated: Secondary | ICD-10-CM | POA: Diagnosis not present

## 2022-05-24 DIAGNOSIS — F900 Attention-deficit hyperactivity disorder, predominantly inattentive type: Secondary | ICD-10-CM | POA: Diagnosis not present

## 2022-05-24 DIAGNOSIS — F1010 Alcohol abuse, uncomplicated: Secondary | ICD-10-CM | POA: Diagnosis not present

## 2022-05-24 DIAGNOSIS — F411 Generalized anxiety disorder: Secondary | ICD-10-CM | POA: Diagnosis not present

## 2022-06-03 DIAGNOSIS — G4733 Obstructive sleep apnea (adult) (pediatric): Secondary | ICD-10-CM | POA: Diagnosis not present

## 2022-06-04 DIAGNOSIS — L280 Lichen simplex chronicus: Secondary | ICD-10-CM | POA: Diagnosis not present

## 2022-06-04 DIAGNOSIS — D229 Melanocytic nevi, unspecified: Secondary | ICD-10-CM | POA: Diagnosis not present

## 2022-06-04 DIAGNOSIS — G4733 Obstructive sleep apnea (adult) (pediatric): Secondary | ICD-10-CM | POA: Diagnosis not present

## 2022-06-04 DIAGNOSIS — L814 Other melanin hyperpigmentation: Secondary | ICD-10-CM | POA: Diagnosis not present

## 2022-06-04 DIAGNOSIS — E663 Overweight: Secondary | ICD-10-CM | POA: Diagnosis not present

## 2022-06-11 DIAGNOSIS — F900 Attention-deficit hyperactivity disorder, predominantly inattentive type: Secondary | ICD-10-CM | POA: Diagnosis not present

## 2022-06-11 DIAGNOSIS — F411 Generalized anxiety disorder: Secondary | ICD-10-CM | POA: Diagnosis not present

## 2022-06-11 DIAGNOSIS — F1011 Alcohol abuse, in remission: Secondary | ICD-10-CM | POA: Diagnosis not present

## 2022-06-14 DIAGNOSIS — F1011 Alcohol abuse, in remission: Secondary | ICD-10-CM | POA: Diagnosis not present

## 2022-06-14 DIAGNOSIS — F411 Generalized anxiety disorder: Secondary | ICD-10-CM | POA: Diagnosis not present

## 2022-06-14 DIAGNOSIS — F900 Attention-deficit hyperactivity disorder, predominantly inattentive type: Secondary | ICD-10-CM | POA: Diagnosis not present

## 2022-07-03 DIAGNOSIS — G4733 Obstructive sleep apnea (adult) (pediatric): Secondary | ICD-10-CM | POA: Diagnosis not present

## 2022-07-04 DIAGNOSIS — F411 Generalized anxiety disorder: Secondary | ICD-10-CM | POA: Diagnosis not present

## 2022-07-04 DIAGNOSIS — F1011 Alcohol abuse, in remission: Secondary | ICD-10-CM | POA: Diagnosis not present

## 2022-07-04 DIAGNOSIS — F900 Attention-deficit hyperactivity disorder, predominantly inattentive type: Secondary | ICD-10-CM | POA: Diagnosis not present

## 2022-07-09 ENCOUNTER — Other Ambulatory Visit: Payer: Self-pay | Admitting: Cardiology

## 2022-07-26 DIAGNOSIS — F411 Generalized anxiety disorder: Secondary | ICD-10-CM | POA: Diagnosis not present

## 2022-07-26 DIAGNOSIS — F1011 Alcohol abuse, in remission: Secondary | ICD-10-CM | POA: Diagnosis not present

## 2022-07-26 DIAGNOSIS — F900 Attention-deficit hyperactivity disorder, predominantly inattentive type: Secondary | ICD-10-CM | POA: Diagnosis not present

## 2022-08-03 DIAGNOSIS — G4733 Obstructive sleep apnea (adult) (pediatric): Secondary | ICD-10-CM | POA: Diagnosis not present

## 2022-08-16 DIAGNOSIS — F1011 Alcohol abuse, in remission: Secondary | ICD-10-CM | POA: Diagnosis not present

## 2022-08-16 DIAGNOSIS — F900 Attention-deficit hyperactivity disorder, predominantly inattentive type: Secondary | ICD-10-CM | POA: Diagnosis not present

## 2022-08-16 DIAGNOSIS — F411 Generalized anxiety disorder: Secondary | ICD-10-CM | POA: Diagnosis not present

## 2022-09-05 DIAGNOSIS — G4733 Obstructive sleep apnea (adult) (pediatric): Secondary | ICD-10-CM | POA: Diagnosis not present

## 2022-09-06 DIAGNOSIS — F411 Generalized anxiety disorder: Secondary | ICD-10-CM | POA: Diagnosis not present

## 2022-09-06 DIAGNOSIS — F900 Attention-deficit hyperactivity disorder, predominantly inattentive type: Secondary | ICD-10-CM | POA: Diagnosis not present

## 2022-09-06 DIAGNOSIS — F1011 Alcohol abuse, in remission: Secondary | ICD-10-CM | POA: Diagnosis not present

## 2022-09-20 DIAGNOSIS — F900 Attention-deficit hyperactivity disorder, predominantly inattentive type: Secondary | ICD-10-CM | POA: Diagnosis not present

## 2022-09-20 DIAGNOSIS — F1010 Alcohol abuse, uncomplicated: Secondary | ICD-10-CM | POA: Diagnosis not present

## 2022-09-20 DIAGNOSIS — F411 Generalized anxiety disorder: Secondary | ICD-10-CM | POA: Diagnosis not present

## 2022-09-26 ENCOUNTER — Other Ambulatory Visit: Payer: Self-pay | Admitting: Internal Medicine

## 2022-10-04 DIAGNOSIS — G4733 Obstructive sleep apnea (adult) (pediatric): Secondary | ICD-10-CM | POA: Diagnosis not present

## 2022-10-08 ENCOUNTER — Encounter: Payer: Self-pay | Admitting: Internal Medicine

## 2022-10-08 ENCOUNTER — Ambulatory Visit (INDEPENDENT_AMBULATORY_CARE_PROVIDER_SITE_OTHER): Payer: BC Managed Care – PPO | Admitting: Internal Medicine

## 2022-10-08 VITALS — BP 136/80 | HR 87 | Temp 97.7°F | Resp 16 | Ht 73.0 in | Wt 196.0 lb

## 2022-10-08 DIAGNOSIS — R6882 Decreased libido: Secondary | ICD-10-CM

## 2022-10-08 DIAGNOSIS — E785 Hyperlipidemia, unspecified: Secondary | ICD-10-CM | POA: Diagnosis not present

## 2022-10-08 DIAGNOSIS — I10 Essential (primary) hypertension: Secondary | ICD-10-CM | POA: Diagnosis not present

## 2022-10-08 DIAGNOSIS — Z125 Encounter for screening for malignant neoplasm of prostate: Secondary | ICD-10-CM | POA: Diagnosis not present

## 2022-10-08 DIAGNOSIS — R5383 Other fatigue: Secondary | ICD-10-CM | POA: Diagnosis not present

## 2022-10-08 DIAGNOSIS — Z0001 Encounter for general adult medical examination with abnormal findings: Secondary | ICD-10-CM

## 2022-10-08 DIAGNOSIS — Z0000 Encounter for general adult medical examination without abnormal findings: Secondary | ICD-10-CM

## 2022-10-08 LAB — COMPREHENSIVE METABOLIC PANEL
ALT: 30 U/L (ref 0–53)
AST: 22 U/L (ref 0–37)
Albumin: 4.7 g/dL (ref 3.5–5.2)
Alkaline Phosphatase: 88 U/L (ref 39–117)
BUN: 15 mg/dL (ref 6–23)
CO2: 29 mEq/L (ref 19–32)
Calcium: 9 mg/dL (ref 8.4–10.5)
Chloride: 105 mEq/L (ref 96–112)
Creatinine, Ser: 0.9 mg/dL (ref 0.40–1.50)
GFR: 99 mL/min (ref >60.00)
Glucose, Bld: 106 mg/dL — ABNORMAL HIGH (ref 70–99)
Potassium: 4.2 mEq/L (ref 3.5–5.1)
Sodium: 142 mEq/L (ref 135–145)
Total Bilirubin: 0.3 mg/dL (ref 0.2–1.2)
Total Protein: 6.8 g/dL (ref 6.0–8.3)

## 2022-10-08 LAB — LIPID PANEL
Cholesterol: 143 mg/dL (ref 0–200)
HDL: 80.8 mg/dL (ref >39.00)
LDL Cholesterol: 53 mg/dL (ref 0–99)
NonHDL: 62.02
Total CHOL/HDL Ratio: 2
Triglycerides: 45 mg/dL (ref 0.0–149.0)
VLDL: 9 mg/dL (ref 0.0–40.0)

## 2022-10-08 LAB — B12 AND FOLATE PANEL
Folate: 23.1 ng/mL (ref >5.9)
Vitamin B-12: 430 pg/mL (ref 211–911)

## 2022-10-08 LAB — CBC WITH DIFFERENTIAL/PLATELET
Basophils Absolute: 0 10*3/uL (ref 0.0–0.1)
Basophils Relative: 0.6 % (ref 0.0–3.0)
Eosinophils Absolute: 0 10*3/uL (ref 0.0–0.7)
Eosinophils Relative: 0.6 % (ref 0.0–5.0)
HCT: 42.1 % (ref 39.0–52.0)
Hemoglobin: 14.5 g/dL (ref 13.0–17.0)
Lymphocytes Relative: 22.1 % (ref 12.0–46.0)
Lymphs Abs: 1.4 10*3/uL (ref 0.7–4.0)
MCHC: 34.5 g/dL (ref 30.0–36.0)
MCV: 90.4 fl (ref 78.0–100.0)
Monocytes Absolute: 0.4 10*3/uL (ref 0.1–1.0)
Monocytes Relative: 6.9 % (ref 3.0–12.0)
Neutro Abs: 4.5 10*3/uL (ref 1.4–7.7)
Neutrophils Relative %: 69.8 % (ref 43.0–77.0)
Platelets: 218 10*3/uL (ref 150.0–400.0)
RBC: 4.66 Mil/uL (ref 4.22–5.81)
RDW: 13.1 % (ref 11.5–15.5)
WBC: 6.5 10*3/uL (ref 4.0–10.5)

## 2022-10-08 LAB — TSH: TSH: 2.26 u[IU]/mL (ref 0.35–5.50)

## 2022-10-08 LAB — TESTOSTERONE: Testosterone: 426.25 ng/dL (ref 300.00–890.00)

## 2022-10-08 LAB — HEMOGLOBIN A1C: Hgb A1c MFr Bld: 5.8 % (ref 4.6–6.5)

## 2022-10-08 LAB — PSA: PSA: 0.93 ng/mL (ref 0.10–4.00)

## 2022-10-08 NOTE — Patient Instructions (Addendum)
Check the  blood pressure regularly BP GOAL is between 110/65 and  135/85. If it is consistently higher or lower, let me know  Recommend to see your eye doctor yearly.  GO TO THE LAB : Get the blood work     [place], [place] back for a checkup in 6 months

## 2022-10-08 NOTE — Progress Notes (Unsigned)
Subjective:    Patient ID: Troy Leonard, male    DOB: 10-Feb-1971, 52 y.o.   MRN: [id number]  DOS:  10/08/2022 Type of visit - description: CPX  Here for CPX. Also has some other concerns: Complained of some fatigue, feels more tired than usual, occasionally takes a nap. Also back pain on and off, could be left or right, sometimes radiate to the buttock or even the leg. No injury.  Denies fever or chills No chest pain no difficulty breathing Good compliance with CPAP No LUTS No unusual anxiety or stress.   Review of Systems See above   Past Medical History:  Diagnosis Date   ABNORMAL ECHOCARDIOGRAM 06/15/2009   Qualifier: Diagnosis of  By: Angelena Form, MD, [place]     Abnormal EKG    LVH, iRBB, sees cards    Anxiety    sees Dr Caprice Beaver   ANXIETY 05/19/2008   Qualifier: Diagnosis of  By: Larose Kells MD, [place] 04/06/2008   Qualifier: Diagnosis of  By: Larose Kells MD, [place]    Benign neoplasm of skin 06/05/2009   Qualifier: Diagnosis of  By: Larose Kells MD, [place]    Dyslipidemia 10/08/2012   Epididymal cyst 05/20/2011   Essential hypertension 05/19/2008   Qualifier: Diagnosis of  By: Larose Kells MD, [place]    Hyperlipidemia    Hypertension    Multiple pigmented nevi    saw derm before    RBBB 06/15/2009   Qualifier: Diagnosis of  By: Earley Favor RN, BSN, Leonie Man    Sleep apnea    WEARS CPAP    Throat pain 04/03/2017    Past Surgical History:  Procedure Laterality Date   FINGER SURGERY     AS A CHILD    TOOTH EXTRACTION     WITH SEDATION, NOT WISDOM TEETH   VASECTOMY  2004    Current Outpatient Medications  Medication Instructions   atorvastatin (LIPITOR) 40 mg, Oral, Daily   clonazePAM (KLONOPIN) 2 mg, Oral, 3 times daily PRN   ezetimibe (ZETIA) 10 mg, Oral, Daily   Multiple Vitamin (MULTIVITAMIN) tablet 1 tablet, Oral, Daily,     psyllium (METAMUCIL) 58.6 % powder 1 teaspoon once daily    sildenafil (REVATIO) 20 MG tablet TAKE 3-4 TABLETS BY MOUTH AT BEDTIME AS NEEDED.  PRIOR AUTH DENIED. PLACED ON DISCOUNT CARD.   Vyvanse 50 mg, Oral, Every morning       Objective:   Physical Exam BP 136/80   Pulse 87   Temp 97.7 F (36.5 C) (Oral)   Resp 16   Ht 6\' 1"  (1.854 m)   Wt 196 lb (88.9 kg)   SpO2 98%   BMI 25.86 kg/m  General: Well developed, NAD, BMI noted Neck: No  thyromegaly  HEENT:  Normocephalic . Face symmetric, atraumatic Lungs:  CTA B Normal respiratory effort, no intercostal retractions, no accessory muscle use. Heart: RRR,  no murmur.  Abdomen:  Not distended, soft, non-tender. No rebound or rigidity.   Lower extremities: no pretibial edema bilaterally  Skin: Exposed areas without rash. Not pale. Not jaundice Neurologic:  alert & oriented X3.  Speech normal, gait appropriate for age and unassisted Strength symmetric and appropriate for age. Straight leg test negative Psych: Cognition and judgment appear intact.  Cooperative with normal attention span and concentration.  Behavior appropriate. No anxious or depressed appearing.     Assessment     Assessment HTN - on no meds as of 12-2015 Hyperlipidemia CV: ---RBBB, see  cards ---Trivial Ao insufficiency  by echo,  echo 12-2014 stable, echo 06-2021 ---Ca+ Coronary score 06/2021: 42  - (+) FH CAD , mother in her 10s OSA on CPAP dx 12/2019 Anxiety,ADD  sees Dr. Hoover Browns (clonazepam) Multiple nevus, sees derm   PLAN: Here for CPX HTN: On no medications, BP looks good Hyperlipidemia: On atorvastatin-Zetia, checking labs Coronary calcium score: 104 Saw cardiology October 2023, was Rx to continue Zetia and Lipitor. Anxiety, ADD: Reportedly well-controlled.  Managed elsewhere OSA: Reports good compliance, no apparent leaks. Fatigue: As described above, review of systems is benign, good CPAP compliance.  Does take clonazepam at least twice daily that may account for some drowsiness. He has a history of ED and slightly decreased sex drive.  We agreed to do more extensive blood  work including vitamins and testosterone level.  Caveats of testosterone check d/w pt (particularly because this is not a early morning sample). RTC 6 months  - Td: 2022 - s/p  Shingrix x 2 per pt  -COVID VAX : utd - flu shot q fall  --CCS: cscope 11-2020, next 11-2027 per GI letter  -Prostate cancer screening: No symptoms, DRE normal 2023, check PSA - Diet exercise: Counseled.  Doing well.  Plays golf. --Labs:  CMP FLP CBC A1c TSH PSA total testosterone 123456 folic acid

## 2022-10-09 ENCOUNTER — Encounter: Payer: Self-pay | Admitting: Internal Medicine

## 2022-10-09 DIAGNOSIS — F900 Attention-deficit hyperactivity disorder, predominantly inattentive type: Secondary | ICD-10-CM | POA: Diagnosis not present

## 2022-10-09 DIAGNOSIS — F1010 Alcohol abuse, uncomplicated: Secondary | ICD-10-CM | POA: Diagnosis not present

## 2022-10-09 DIAGNOSIS — F411 Generalized anxiety disorder: Secondary | ICD-10-CM | POA: Diagnosis not present

## 2022-10-09 NOTE — Assessment & Plan Note (Signed)
Here for CPX.  Other issues were discussed, see below HTN: On no medications, BP looks good Hyperlipidemia:   Coronary calcium score: 92 Saw cardiology October 2023, was Rx to continue Zetia and Lipitor.  Checking labs. Anxiety, ADD: Reportedly well-controlled.  Managed elsewhere OSA: Reports good CPAP compliance, no apparent leaks. Fatigue: As described above, review of systems is benign, good CPAP compliance.  Does take clonazepam at least twice daily that may account for some drowsiness. Troy Leonard has a history of ED and slightly decreased sex drive.  We agreed to do more extensive blood work including vitamins and testosterone level.  Caveats of testosterone check d/w pt (particularly because this is not a early morning sample). RTC 6 months

## 2022-10-09 NOTE — Assessment & Plan Note (Signed)
-   Td: 2022 - s/p  Shingrix x 2 per pt  -COVID VAX : utd - flu shot q fall  --CCS: cscope 11-2020, next 11-2027 per GI letter  -Prostate cancer screening: No symptoms, DRE normal 2023, check PSA - Diet exercise: Counseled.  Doing well.  Plays golf. --Labs:  CMP FLP CBC A1c TSH PSA total testosterone 123456 folic acid

## 2022-10-28 ENCOUNTER — Encounter: Payer: Self-pay | Admitting: *Deleted

## 2022-11-10 ENCOUNTER — Other Ambulatory Visit: Payer: Self-pay | Admitting: Cardiology

## 2022-11-10 IMAGING — CT CT CARDIAC CORONARY ARTERY CALCIUM SCORE
1 series · 1 of 2 positions shown · non-contrast
Comparison: None.
COMPARISON: None.

Addendum:
EXAM:
OVER-READ INTERPRETATION  CT CHEST

The following report is an over-read performed by radiologist Dr.
Jofeth Serafim [REDACTED] on 07/04/2021. This
over-read does not include interpretation of cardiac or coronary
anatomy or pathology. The coronary calcium score interpretation by
the cardiologist is attached.
CLINICAL DATA: Cardiovascular Disease Risk stratification
Coronary Calcium Score
TECHNIQUE: A gated, non-contrast computed tomography scan of the heart was
performed using 3mm slice thickness. Axial images were analyzed on a
dedicated workstation. Calcium scoring of the coronary arteries was
performed using the Agatston method.

[Series 858: — · 0.43mm/px · 1 of 2 slices shown]
[im 2/2]
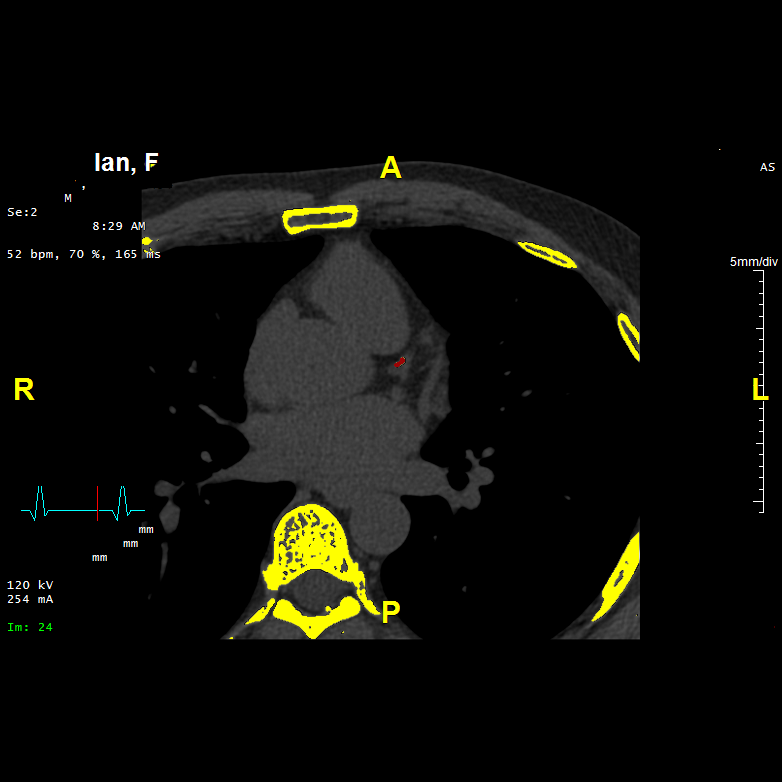

[1 of 2 positions shown; findings below may reference images not displayed]

FINDINGS: Vascular: No significant noncardiac vascular findings.

Mediastinum/Nodes: Visualized mediastinum and hilar regions
demonstrate no lymphadenopathy or masses.

Lungs/Pleura: Visualized lungs show no evidence of pulmonary edema,
consolidation, pneumothorax, nodule or pleural fluid.

Upper Abdomen: 8 mm cyst in the superior and anterior right lobe of
the liver has a benign appearance.

Musculoskeletal: No chest wall mass or suspicious bone lesions
identified.
IMPRESSION: No significant incidental findings. 8 mm benign cyst of the liver
noted by CT.
FINDINGS: Coronary arteries: Normal origins.

Coronary Calcium Score:

Left main: 0

Left anterior descending artery: 49

Left circumflex artery: 0

Right coronary artery: 0

Total: 49

Percentile: 82

Pericardium: Normal.

Ascending Aorta: Normal caliber.

Non-cardiac: See separate report from [REDACTED].
IMPRESSION: Coronary calcium score of 49. This was 82 percentile for age-,
race-, and sex-matched controls.



If CAC=0, it is reasonable to withhold statin therapy and reassess
in 5 to 10 years, as long as higher risk conditions are absent
(diabetes mellitus, family history of premature CHD in first degree
relatives (males <55 years; females <65 years), cigarette smoking,
or LDL >=190 mg/dL).

If CAC is 1 to 99, it is reasonable to initiate statin therapy for
patients >=55 years of age.

If CAC is >=100 or >=75th percentile, it is reasonable to initiate
statin therapy at any age.

Cardiology referral should be considered for patients with CAC
scores >=400 or >=75th percentile.

*9364 AHA/ACC/AACVPR/AAPA/ABC/AKYYEW/MELITON/SIMONETTI/Minor/ANALY/SHUKWAN/CEEJAY
Guideline on the Management of Blood Cholesterol: A Report of the
American College of Cardiology/American Heart Association Task Force
on Clinical Practice Guidelines. J Am Coll Cardiol.
6722;73(24):2264-2932.

*** End of Addendum ***
EXAM:
OVER-READ INTERPRETATION  CT CHEST

The following report is an over-read performed by radiologist Dr.
Jofeth Serafim [REDACTED] on 07/04/2021. This
over-read does not include interpretation of cardiac or coronary
anatomy or pathology. The coronary calcium score interpretation by
the cardiologist is attached.
FINDINGS: Vascular: No significant noncardiac vascular findings.

Mediastinum/Nodes: Visualized mediastinum and hilar regions
demonstrate no lymphadenopathy or masses.

Lungs/Pleura: Visualized lungs show no evidence of pulmonary edema,
consolidation, pneumothorax, nodule or pleural fluid.

Upper Abdomen: 8 mm cyst in the superior and anterior right lobe of
the liver has a benign appearance.

Musculoskeletal: No chest wall mass or suspicious bone lesions
identified.
IMPRESSION: No significant incidental findings. 8 mm benign cyst of the liver
noted by CT.

## 2023-01-04 ENCOUNTER — Encounter: Payer: Self-pay | Admitting: Cardiovascular Disease

## 2023-02-04 ENCOUNTER — Ambulatory Visit: Payer: BC Managed Care – PPO | Admitting: Physician Assistant

## 2023-02-05 NOTE — Progress Notes (Unsigned)
Cardiology Office Note:  .   Date:  02/06/2023  ID:  TYRI ELMORE, DOB 08-10-1970, MRN [id number] PCP: Wanda Plump, MD  [facility] Providers Cardiologist:  Verne Carrow, MD Sleep Medicine:  Armanda Magic, MD {  History of Present Illness: .   Troy Leonard is a 52 y.o. male with a past medical history of hypertension, hyperlipidemia, OSA on CPAP, and RBBB seen for follow-up appointment.  History includes echocardiogram December 2022 with LV function of 60 to 65%, no RWMA.  CT cardiac scoring with coronary calcium score 49, 82nd percentile.  Last seen in the clinic 10/23 and was not on any hypertensive medications for years.  Used to take benazepril on review of chart.  Denied chest pain, shortness of breath, orthopnea, PND, syncope, lower extremity edema, palpitations or dizziness.  Used to do cardiovascular exercises but none recently.  Compliant with CPAP.  Today, he states that he is having some abnormal heart rate alerts and he is having more stress, tension, and anxiety with work. He does travel a lot. He also states he has had some chest tightness. Usually happens with stress, a big meal, and maybe at night. Not usually when he is doing his walking. He used to run pre-covid but is having some back issues so he has been doing some walking and yoga recently. He has a strong family history of heart issues with his mother needing a heart transplant and then dying from a heart attack in 2023 at age 63. She also had CHF.  Reports no shortness of breath nor dyspnea on exertion. No edema, orthopnea, PND.    ROS: Pertinent ROS in HPI  Studies Reviewed: .        CT cardiac scoring 06/2021 FINDINGS: Coronary arteries: Normal origins.   Coronary Calcium Score:   Left main: 0   Left anterior descending artery: 49   Left circumflex artery: 0   Right coronary artery: 0   Total: 49   Percentile: 82   Pericardium: Normal.   Ascending Aorta: Normal caliber.    Non-cardiac: See separate report from [facility].   IMPRESSION: Coronary calcium score of 49. This was 58 percentile for age-, race-, and sex-matched controls.   Echo 06/2021 IMPRESSIONS  1. Left ventricular ejection fraction, by estimation, is 60 to 65%. The  left ventricle has normal function. The left ventricle has no regional  wall motion abnormalities. The left ventricular internal cavity size was  mildly to moderately dilated. Left  ventricular diastolic parameters were normal.   2. Right ventricular systolic function is normal. The right ventricular  size is normal. There is normal pulmonary artery systolic pressure.   3. Left atrial size was moderately dilated.   4. Right atrial size was mildly dilated.   5. The mitral valve is normal in structure. Trivial mitral valve  regurgitation. No evidence of mitral stenosis.   6. The aortic valve is tricuspid. Aortic valve regurgitation is mild.   7. The inferior vena cava is normal in size with greater than 50%  respiratory variability, suggesting right atrial pressure of 3 mmHg.   Comparison(s): No significant change from prior study. Prior images  reviewed side by side.   Conclusion(s)/Recommendation(s): Otherwise normal echocardiogram, with  minor abnormalities described in the report.        Physical Exam:   VS:  BP 120/78   Pulse 67   Ht 6\' 1"  (1.854 m)   Wt 196 lb (88.9 kg)   SpO2  98%   BMI 25.86 kg/m    Wt Readings from Last 3 Encounters:  02/06/23 196 lb (88.9 kg)  10/08/22 196 lb (88.9 kg)  05/09/22 197 lb 9.6 oz (89.6 kg)    GEN: Well nourished, well developed in no acute distress NECK: No JVD; No carotid bruits CARDIAC: RRR, no murmurs, rubs, gallops RESPIRATORY:  Clear to auscultation without rales, wheezing or rhonchi  ABDOMEN: Soft, non-tender, non-distended EXTREMITIES:  No edema; No deformity   ASSESSMENT AND PLAN: .   1.  HTN -well controlled today -Continue current medication regimen  which includes Lipitor 40 mg daily, Zetia 10 mg daily  2.  HLD -Continue current medication regimen -LDL 53, at goal  3.  Elevated calcium score/CAD -Endorses chest pain and palpitations today -Will plan to get a coronary CTA to further assess his CAD -Based on results, would consider starting ASA 81 mg daily -Continue risk reduction with exercise most days of the week and maintaining a heart healthy, low-sodium diet (Mediterranean diet)  4.  OSA on CPAP   -Follows up with Dr. Mayford Knife   Dispo: Follow-up in 6 weeks after testing  Signed, Sharlene Dory, PA-C

## 2023-02-06 ENCOUNTER — Ambulatory Visit: Payer: 59 | Attending: Physician Assistant | Admitting: Physician Assistant

## 2023-02-06 ENCOUNTER — Encounter: Payer: Self-pay | Admitting: Physician Assistant

## 2023-02-06 VITALS — BP 120/78 | HR 67 | Ht 73.0 in | Wt 196.0 lb

## 2023-02-06 DIAGNOSIS — R931 Abnormal findings on diagnostic imaging of heart and coronary circulation: Secondary | ICD-10-CM | POA: Diagnosis not present

## 2023-02-06 DIAGNOSIS — E785 Hyperlipidemia, unspecified: Secondary | ICD-10-CM

## 2023-02-06 DIAGNOSIS — I10 Essential (primary) hypertension: Secondary | ICD-10-CM | POA: Diagnosis not present

## 2023-02-06 DIAGNOSIS — G4733 Obstructive sleep apnea (adult) (pediatric): Secondary | ICD-10-CM

## 2023-02-06 DIAGNOSIS — R079 Chest pain, unspecified: Secondary | ICD-10-CM

## 2023-02-06 MED ORDER — METOPROLOL TARTRATE 100 MG PO TABS: ORAL_TABLET | ORAL | 0 refills | Status: DC

## 2023-02-06 NOTE — Patient Instructions (Addendum)
Medication Instructions:  Your physician recommends that you continue on your current medications as directed. Please refer to the Current Medication list given to you today.  *If you need a refill on your cardiac medications before your next appointment, please call your pharmacy*   Lab Work: Bmp - today   If you have labs (blood work) drawn today and your tests are completely normal, you will receive your results only by: MyChart Message (if you have MyChart) OR A paper copy in the mail If you have any lab test that is abnormal or we need to change your treatment, we will call you to review the results.   Testing/Procedures: Your physician has ordered for you to have a coronary CTA. Please refer to the instructions below    Follow-Up: At [facility], you and your health needs are our priority.  As part of our continuing mission to provide you with exceptional heart care, we have created designated Provider Care Teams.  These Care Teams include your primary Cardiologist (physician) and Advanced Practice Providers (APPs -  Physician Assistants and Nurse Practitioners) who all work together to provide you with the care you need, when you need it.  We recommend signing up for the patient portal called "MyChart".  Sign up information is provided on this After Visit Summary.  MyChart is used to connect with patients for Virtual Visits (Telemedicine).  Patients are able to view lab/test results, encounter notes, upcoming appointments, etc.  Non-urgent messages can be sent to your provider as well.   To learn more about what you can do with MyChart, go to [email or web address].    Your next appointment:   2 month(s)  Provider:   Verne Carrow, MD  or APP   Other Instructions   Your cardiac CT will be scheduled at one of the below locations:   [facility] [address] [place], [place] [address] [phone]  If scheduled at [facility],  please arrive at the [facility] and Children's Entrance (Entrance C2) of [facility] 30 minutes prior to test start time. You can use the FREE valet parking offered at entrance C (encouraged to control the heart rate for the test)  Proceed to the [facility] Radiology Department (first floor) to check-in and test prep.  All radiology patients and guests should use entrance C2 at [facility], accessed from [facility], even though the hospital's physical address listed is [address]      Please follow these instructions carefully (unless otherwise directed):  An IV will be required for this test and Nitroglycerin will be given.  Hold all erectile dysfunction medications at least 3 days (72 hrs) prior to test. (Ie viagra, cialis, sildenafil, tadalafil, etc)   On the Night Before the Test: Be sure to Drink plenty of water. Do not consume any caffeinated/decaffeinated beverages or chocolate 12 hours prior to your test. Do not take any antihistamines 12 hours prior to your test.  On the Day of the Test: Drink plenty of water until 1 hour prior to the test. Do not eat any food 1 hour prior to test. You may take your regular medications prior to the test.  Take metoprolol (Lopressor) two hours prior to test.         After the Test: Drink plenty of water. After receiving IV contrast, you may experience a mild flushed feeling. This is normal. On occasion, you may experience a mild rash up to 24  hours after the test. This is not dangerous. If this occurs, you can take Benadryl 25 mg and increase your fluid intake. If you experience trouble breathing, this can be serious. If it is severe call 911 IMMEDIATELY. If it is mild, please call our office. If you take any of these medications: Glipizide/Metformin, Avandament, Glucavance, please do not take 48 hours after completing test unless otherwise instructed.  We will call to schedule your test 2-4 weeks out  understanding that some insurance companies will need an authorization prior to the service being performed.   For more information and frequently asked questions, please visit our website : [email or web address]  For non-scheduling related questions, please contact the cardiac imaging nurse navigator should you have any questions/concerns: Cardiac Imaging Nurse Navigators Direct Office Dial: [phone]   For scheduling needs, including cancellations and rescheduling, please call [place], [phone].

## 2023-02-13 ENCOUNTER — Encounter (HOSPITAL_COMMUNITY): Payer: Self-pay

## 2023-02-13 ENCOUNTER — Telehealth (HOSPITAL_COMMUNITY): Payer: Self-pay | Admitting: *Deleted

## 2023-02-13 NOTE — Telephone Encounter (Signed)
Patient returning call about his  upcoming cardiac imaging study; pt verbalizes understanding of appt date/time, parking situation and where to check in, pre-test NPO status and medications ordered, and verified current allergies; name and call back number provided for further questions should they arise  Larey Brick RN Navigator Cardiac Imaging [facility] [phone] office [phone] cell  Patient to take 50mg  of metoprolol tartrate if his HR is greater than 65 bpm and 100mg  if his HR is greater than 75bpm two hours prior to his cardiac CT scan. He is aware to arrive at 2 PM.

## 2023-02-13 NOTE — Telephone Encounter (Signed)
Attempted to call patient regarding upcoming cardiac CT appointment. °Left message on voicemail with name and callback number ° °Merle Prescott RN Navigator Cardiac Imaging °[facility] °[phone] Office °[phone] Cell ° °

## 2023-02-14 ENCOUNTER — Ambulatory Visit (HOSPITAL_COMMUNITY)
Admission: RE | Admit: 2023-02-14 | Discharge: 2023-02-14 | Disposition: A | Discharge status: Home / Self Care | Payer: 59 | Source: Ambulatory Visit | Attending: Physician Assistant | Admitting: Physician Assistant

## 2023-02-14 DIAGNOSIS — R079 Chest pain, unspecified: Secondary | ICD-10-CM | POA: Insufficient documentation

## 2023-02-14 DIAGNOSIS — R072 Precordial pain: Secondary | ICD-10-CM

## 2023-02-14 MED ORDER — NITROGLYCERIN 0.4 MG SL SUBL
SUBLINGUAL_TABLET | SUBLINGUAL | Status: AC
  Filled 2023-02-14: qty 2

## 2023-02-14 MED ORDER — NITROGLYCERIN 0.4 MG SL SUBL
0.8000 mg | SUBLINGUAL_TABLET | Freq: Once | SUBLINGUAL | Status: AC
  Administered 2023-02-14: 0.8 mg via SUBLINGUAL

## 2023-02-14 MED ORDER — IOHEXOL 350 MG/ML SOLN
95.0000 mL | Freq: Once | INTRAVENOUS | Status: AC | PRN
  Administered 2023-02-14: 95 mL via INTRAVENOUS

## 2023-02-17 ENCOUNTER — Telehealth: Payer: Self-pay | Admitting: Physician Assistant

## 2023-02-17 MED ORDER — ASPIRIN 81 MG PO TBEC: 81.0000 mg | DELAYED_RELEASE_TABLET | Freq: Every day | ORAL | Status: AC

## 2023-02-17 NOTE — Telephone Encounter (Signed)
Spoke with patient and discussed cardiac CT results.  Per Troy Favre, PA-C: Troy Leonard,    Minimal disease in your LAD.  Overall, good result.  Would start ASA 81 mg daily for preventative measures.  Please let me know if you have questions.   Troy Dory, PA-C   ASA 81mg  daily added to medication list.  Patient verbalized understanding, expressed concern and would like to follow-up with provider soon. He states he is unable to make appt scheduled with Troy Searing, NP on 04/11/23. Cancelled appt per patient request.  Offered next available with Troy Leonard on 02/19/23 at 8:00am, patient states he has a PCP appt that day and would not be able to make it.  Offered next available with Troy Leonard on 04/22/23, patient accepted and requested to be placed on waitlist.   Patient has been placed on waitlist for appt with either Troy Leonard or Troy Leonard.

## 2023-02-17 NOTE — Telephone Encounter (Signed)
-----   Message from Sharlene Dory sent at 02/14/2023  8:08 PM EDT ----- Mr. Engelson,   Minimal disease in your LAD.  Overall, good result.  Would start ASA 81 mg daily for preventative measures.  Please let me know if you have questions.  Sharlene Dory, PA-C

## 2023-02-19 ENCOUNTER — Ambulatory Visit (INDEPENDENT_AMBULATORY_CARE_PROVIDER_SITE_OTHER): Payer: 59 | Admitting: Internal Medicine

## 2023-02-19 ENCOUNTER — Encounter: Payer: Self-pay | Admitting: Internal Medicine

## 2023-02-19 VITALS — BP 124/82 | HR 63 | Ht 73.0 in | Wt 193.6 lb

## 2023-02-19 DIAGNOSIS — I2583 Coronary atherosclerosis due to lipid rich plaque: Secondary | ICD-10-CM

## 2023-02-19 DIAGNOSIS — I10 Essential (primary) hypertension: Secondary | ICD-10-CM | POA: Diagnosis not present

## 2023-02-19 DIAGNOSIS — I2510 Atherosclerotic heart disease of native coronary artery without angina pectoris: Secondary | ICD-10-CM | POA: Diagnosis not present

## 2023-02-19 DIAGNOSIS — F419 Anxiety disorder, unspecified: Secondary | ICD-10-CM

## 2023-02-19 DIAGNOSIS — E785 Hyperlipidemia, unspecified: Secondary | ICD-10-CM | POA: Diagnosis not present

## 2023-02-19 LAB — HEPATIC FUNCTION PANEL
ALT: 42 U/L (ref 0–53)
AST: 64 U/L — ABNORMAL HIGH (ref 0–37)
Albumin: 4.8 g/dL (ref 3.5–5.2)
Alkaline Phosphatase: 64 U/L (ref 39–117)
Bilirubin, Direct: 0.2 mg/dL (ref 0.0–0.3)
Total Bilirubin: 0.8 mg/dL (ref 0.2–1.2)
Total Protein: 6.9 g/dL (ref 6.0–8.3)

## 2023-02-19 MED ORDER — SILDENAFIL CITRATE 20 MG PO TABS: 60.0000 mg | ORAL_TABLET | Freq: Every evening | ORAL | 3 refills | Status: AC | PRN

## 2023-02-19 NOTE — Progress Notes (Addendum)
Subjective:    Patient ID: Troy Leonard, male    DOB: 1971/06/20, 52 y.o.   MRN: [id number]  DOS:  02/19/2023 Type of visit - description: Follow-up  Multiple concerns For the last 6 to 8 weeks, his smart watch has alerted him that his heart rate is elevated.  No associated w/  any physical activity but at times related to increased emotional stress. No associated chest pain per se ( rather like a "clinching" feeling on the chest  ), no shortness of breath.  No nausea vomiting. Saw cardiology w/ above sxs , records reviewed.  Anxiety is increased.  Ambulatory BPs are checked daily and they are really good.  Request a refill on sildenafil.  Request dietary advice    Review of Systems See above   Past Medical History:  Diagnosis Date   ABNORMAL ECHOCARDIOGRAM 06/15/2009   Qualifier: Diagnosis of  By: Clifton James, MD, Christopher     Abnormal EKG    LVH, iRBB, sees cards    Anxiety    sees [name]   ANXIETY 05/19/2008   Qualifier: Diagnosis of  By: Drue Novel MD, Nolon Rod.    Backache 04/06/2008   Qualifier: Diagnosis of  By: Drue Novel MD, Nolon Rod.    Benign neoplasm of skin 06/05/2009   Qualifier: Diagnosis of  By: Drue Novel MD, Nolon Rod.    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    Hyperlipidemia    Hypertension    Multiple pigmented nevi    saw derm before    RBBB 06/15/2009   Qualifier: Diagnosis of  By: Meryl Crutch, RN, BSN, Doreatha Lew    Sleep apnea    WEARS CPAP    Throat pain 04/03/2017    Past Surgical History:  Procedure Laterality Date   FINGER SURGERY     AS A CHILD    TOOTH EXTRACTION     WITH SEDATION, NOT WISDOM TEETH   VASECTOMY  2004    Current Outpatient Medications  Medication Instructions   aspirin EC 81 mg, Oral, Daily, Swallow whole.   atorvastatin (LIPITOR) 40 mg, Oral, Daily   clonazePAM (KLONOPIN) 1 mg, Oral, 3 times daily PRN   Coenzyme Q10 60 MG CAPS Oral    ezetimibe (ZETIA) 10 mg, Oral, Daily   Multiple Vitamin (MULTIVITAMIN) tablet 1 tablet, Oral, Daily,     psyllium (METAMUCIL) 58.6 % powder 1 teaspoon once daily    sildenafil (REVATIO) 60-80 mg, Oral, At bedtime PRN   Vyvanse 50 mg, Oral, Every morning       Objective:   Physical Exam BP 124/82 (BP Location: Left Arm, Patient Position: Sitting, Cuff Size: Normal)   Pulse 63   Ht 6\' 1"  (1.854 m)   Wt 193 lb 9.6 oz (87.8 kg)   SpO2 100%   BMI 25.54 kg/m  General:   Well developed, NAD, BMI noted. HEENT:  Normocephalic . Face symmetric, atraumatic Lungs:  CTA B Normal respiratory effort, no intercostal retractions, no accessory muscle use. Heart: RRR,  no murmur.  Lower extremities: no pretibial edema bilaterally  Skin: Not pale. Not jaundice Neurologic:  alert & oriented X3.  Speech normal, gait appropriate for age and unassisted Psych--  Cognition and judgment appear intact.  Cooperative with normal attention span and concentration.  Behavior appropriate. No anxious or depressed appearing.      Assessment      Assessment HTN -  on no meds as of 12-2015 Hyperlipidemia CV: ---RBBB, see cards ---Trivial Ao insufficiency  by echo,  echo 12-2014 stable, echo 06-2021 ---Ca+ Coronary score 06/2021: 49  - (+) FH CAD , mother in her 33s OSA on CPAP dx 12/2019 Anxiety,ADD  sees Dr. Kirke Shaggy (clonazepam) Multiple nevus, sees derm   PLAN: HTN: On no meds, ambulatory BPs in the morning 117/75 on average, heart rate 54. High cholesterol, last LDL 53, on atorva-zetia Fatigue, see LOV, workup included vitamin levels, testosterone, results WNL. CAD:  Due to elevated heart rate, saw cards on  02/06/2023, a CT coronary morphology with CTA: minimal disease in the LAD, was rec to start aspirin and continue cholesterol treatment.   From my side I recommend to reactivate his [employer], I believe that device has the ability to differentiate sinus tachy from  A-fib and  other  arrhythmias.   He already has a follow-up with cardiology. ED: Requested refill on sildenafil. Anxiety, ADD: Managed elsewhere, significant increase in stress lately (wife having health issues). Dietary advice: We talked about the importance of decrease carbohydrate intake, increase fruit, vegetables, grains, lean meats.  DASH diet recommended. LFTs: ALT was elevated x 1, like that to be recheck again.  Will do.  EtOH intake has decreased , patient said. RTC 09/2023 CPX

## 2023-02-19 NOTE — Assessment & Plan Note (Signed)
HTN: On no meds, ambulatory BPs in the morning 117/75 on average, heart rate 54. High cholesterol, last LDL 53, on atorva-zetia Fatigue, see LOV, workup included vitamin levels, testosterone, results WNL. CAD:  Due to elevated heart rate, saw cards on  02/06/2023, a CT coronary morphology with CTA: minimal disease in the LAD, was rec to start aspirin and continue cholesterol treatment.   From my side I recommend to reactivate his [employer], I believe that device has the ability to differentiate sinus tachy from  A-fib and  other arrhythmias.   He already has a follow-up with cardiology. ED: Requested refill on sildenafil. Anxiety, ADD: Managed elsewhere, significant increase in stress lately (wife having health issues). Dietary advice: We talked about the importance of decrease carbohydrate intake, increase fruit, vegetables, grains, lean meats.  DASH diet recommended. LFTs: ALT was elevated x 1, like that to be recheck again.  Will do.  EtOH intake has decreased , patient said. RTC 09/2023 CPX

## 2023-02-19 NOTE — Patient Instructions (Addendum)
Continue monitoring your blood pressures and heart rate  Please read the information about eating healthy   GO TO THE LAB : Get the blood work     GO TO THE FRONT DESK, PLEASE SCHEDULE YOUR APPOINTMENTS Come back for a physical exam by 09-2023      DASH Eating Plan DASH stands for Dietary Approaches to Stop Hypertension. The DASH eating plan is a healthy eating plan that has been shown to: Lower high blood pressure (hypertension). Reduce your risk for type 2 diabetes, heart disease, and stroke. Help with weight loss. What are tips for following this plan? Reading food labels Check food labels for the amount of salt (sodium) per serving. Choose foods with less than 5 percent of the Daily Value (DV) of sodium. In general, foods with less than 300 milligrams (mg) of sodium per serving fit into this eating plan. To find whole grains, look for the word "whole" as the first word in the ingredient list. Shopping Buy products labeled as "low-sodium" or "no salt added." Buy fresh foods. Avoid canned foods and pre-made or frozen meals. Cooking Try not to add salt when you cook. Use salt-free seasonings or herbs instead of table salt or sea salt. Check with your health care provider or pharmacist before using salt substitutes. Do not fry foods. Cook foods in healthy ways, such as baking, boiling, grilling, roasting, or broiling. Cook using oils that are good for your heart. These include olive, canola, avocado, soybean, and sunflower oil. Meal planning  Eat a balanced diet. This should include: 4 or more servings of fruits and 4 or more servings of vegetables each day. Try to fill half of your plate with fruits and vegetables. 6-8 servings of whole grains each day. 6 or less servings of lean meat, poultry, or fish each day. 1 oz is 1 serving. A 3 oz (85 g) serving of meat is about the same size as the palm of your hand. One egg is 1 oz (28 g). 2-3 servings of low-fat dairy each day. One  serving is 1 cup (237 mL). 1 serving of nuts, seeds, or beans 5 times each week. 2-3 servings of heart-healthy fats. Healthy fats called omega-3 fatty acids are found in foods such as walnuts, flaxseeds, fortified milks, and eggs. These fats are also found in cold-water fish, such as sardines, salmon, and mackerel. Limit how much you eat of: Canned or prepackaged foods. Food that is high in trans fat, such as fried foods. Food that is high in saturated fat, such as fatty meat. Desserts and other sweets, sugary drinks, and other foods with added sugar. Full-fat dairy products. Do not salt foods before eating. Do not eat more than 4 egg yolks a week. Try to eat at least 2 vegetarian meals a week. Eat more home-cooked food and less restaurant, buffet, and fast food. Lifestyle When eating at a restaurant, ask if your food can be made with less salt or no salt. If you drink alcohol: Limit how much you have to: 0-1 drink a day if you are male. 0-2 drinks a day if you are male. Know how much alcohol is in your drink. In the [place] one drink is one 12 oz bottle of beer (355 mL), one 5 oz glass of wine (148 mL), or one 1 oz glass of hard liquor (44 mL). General information Avoid eating more than 2,300 mg of salt a day. If you have hypertension, you may need to reduce your sodium intake to  1,500 mg a day. Work with your provider to stay at a healthy body weight or lose weight. Ask what the best weight range is for you. On most days of the week, get at least 30 minutes of exercise that causes your heart to beat faster. This may include walking, swimming, or biking. Work with your provider or dietitian to adjust your eating plan to meet your specific calorie needs. What foods should I eat? Fruits All fresh, dried, or frozen fruit. Canned fruits that are in their natural juice and do not have sugar added to them. Vegetables Fresh or frozen vegetables that are raw, steamed, roasted, or grilled.  Low-sodium or reduced-sodium tomato and vegetable juice. Low-sodium or reduced-sodium tomato sauce and tomato paste. Low-sodium or reduced-sodium canned vegetables. Grains Whole-grain or whole-wheat bread. Whole-grain or whole-wheat pasta. Brown rice. Orpah Cobb. Bulgur. Whole-grain and low-sodium cereals. Pita bread. Low-fat, low-sodium crackers. Whole-wheat flour tortillas. Meats and other proteins Skinless chicken or [place]. Ground chicken or [place]. Pork with fat trimmed off. Fish and seafood. Egg whites. Dried beans, peas, or lentils. Unsalted nuts, nut butters, and seeds. Unsalted canned beans. Lean cuts of beef with fat trimmed off. Low-sodium, lean precooked or cured meat, such as sausages or meat loaves. Dairy Low-fat (1%) or fat-free (skim) milk. Reduced-fat, low-fat, or fat-free cheeses. Nonfat, low-sodium ricotta or cottage cheese. Low-fat or nonfat yogurt. Low-fat, low-sodium cheese. Fats and oils Soft margarine without trans fats. Vegetable oil. Reduced-fat, low-fat, or light mayonnaise and salad dressings (reduced-sodium). Canola, safflower, olive, avocado, soybean, and sunflower oils. Avocado. Seasonings and condiments Herbs. Spices. Seasoning mixes without salt. Other foods Unsalted popcorn and pretzels. Fat-free sweets. The items listed above may not be all the foods and drinks you can have. Talk to a dietitian to learn more. What foods should I avoid? Fruits Canned fruit in a light or heavy syrup. Fried fruit. Fruit in cream or butter sauce. Vegetables Creamed or fried vegetables. Vegetables in a cheese sauce. Regular canned vegetables that are not marked as low-sodium or reduced-sodium. Regular canned tomato sauce and paste that are not marked as low-sodium or reduced-sodium. Regular tomato and vegetable juices that are not marked as low-sodium or reduced-sodium. Rosita Fire. Olives. Grains Baked goods made with fat, such as croissants, muffins, or some breads. Dry pasta or  rice meal packs. Meats and other proteins Fatty cuts of meat. Ribs. Fried meat. Tomasa Blase. Bologna, salami, and other precooked or cured meats, such as sausages or meat loaves, that are not lean and low in sodium. Fat from the back of a pig (fatback). Bratwurst. Salted nuts and seeds. Canned beans with added salt. Canned or smoked fish. Whole eggs or egg yolks. Chicken or [place] with skin. Dairy Whole or 2% milk, cream, and half-and-half. Whole or full-fat cream cheese. Whole-fat or sweetened yogurt. Full-fat cheese. Nondairy creamers. Whipped toppings. Processed cheese and cheese spreads. Fats and oils Butter. Stick margarine. Lard. Shortening. Ghee. Bacon fat. Tropical oils, such as coconut, palm kernel, or palm oil. Seasonings and condiments Onion salt, garlic salt, seasoned salt, table salt, and sea salt. Worcestershire sauce. Tartar sauce. Barbecue sauce. Teriyaki sauce. Soy sauce, including reduced-sodium soy sauce. Steak sauce. Canned and packaged gravies. Fish sauce. Oyster sauce. Cocktail sauce. Store-bought horseradish. Ketchup. Mustard. Meat flavorings and tenderizers. Bouillon cubes. Hot sauces. Pre-made or packaged marinades. Pre-made or packaged taco seasonings. Relishes. Regular salad dressings. Other foods Salted popcorn and pretzels. The items listed above may not be all the foods and drinks you should avoid. Talk  to a dietitian to learn more. Where to find more information National Heart, Lung, and Blood Institute (NHLBI): BuffaloDryCleaner.gl American Heart Association (AHA): heart.org Academy of Nutrition and Dietetics: eatright.org National Kidney Foundation (NKF): kidney.org This information is not intended to replace advice given to you by your health care provider. Make sure you discuss any questions you have with your health care provider. Document Revised: 07/18/2022 Document Reviewed: 07/18/2022 Elsevier Patient Education  2024 [employer].

## 2023-02-20 ENCOUNTER — Telehealth: Payer: Self-pay | Admitting: Internal Medicine

## 2023-02-20 DIAGNOSIS — R7989 Other specified abnormal findings of blood chemistry: Secondary | ICD-10-CM

## 2023-02-20 NOTE — Telephone Encounter (Signed)
Pt called to advise he received his lab results and the AST  is 3x higher than the last time it was checked 4 months ago and he is not sure what caused the spike but is concerned and would like a call back to advise.

## 2023-02-21 NOTE — Telephone Encounter (Signed)
Spoke with pt, pt is aware of results and expressed understanding. Pt states he is concerned because the levels have "tripled" in the last 4 months and pt states there is not reason liver enzymes should be elevated as he does not take tylenol and hasn't had an alcoholic beverage in 21 days. Scheduled pt a lab appointment 04/23/2023.

## 2023-02-21 NOTE — Telephone Encounter (Signed)
Advise patient: One of his liver test called AST is minimally elevated, normal is up to 37, his reading is 64.  Because the elevation is so small I am not concerned, and the right thing to do is continue checking it from time to time.  I am planning to do that when he comes back in March.  (however if he likes a sooner blood work that is okay, arrange a lab appointment in 2 months. Let me know and I will place the orders.)

## 2023-02-24 NOTE — Addendum Note (Signed)
Addended byConrad Centerville D on: 02/24/2023 03:47 PM   Modules accepted: Orders

## 2023-02-24 NOTE — Telephone Encounter (Signed)
I pended the needed orders

## 2023-02-24 NOTE — Telephone Encounter (Signed)
Orders placed.

## 2023-02-24 NOTE — Addendum Note (Signed)
Addended by: Willow Ora E on: 02/24/2023 03:41 PM   Modules accepted: Orders

## 2023-03-27 ENCOUNTER — Other Ambulatory Visit: Payer: Self-pay | Admitting: Internal Medicine

## 2023-04-11 ENCOUNTER — Ambulatory Visit: Payer: BC Managed Care – PPO | Admitting: Internal Medicine

## 2023-04-11 ENCOUNTER — Ambulatory Visit: Payer: 59 | Admitting: Nurse Practitioner

## 2023-04-22 ENCOUNTER — Ambulatory Visit: Payer: 59 | Attending: Cardiovascular Disease | Admitting: Cardiovascular Disease

## 2023-04-22 ENCOUNTER — Encounter: Payer: Self-pay | Admitting: Cardiovascular Disease

## 2023-04-22 VITALS — BP 114/80 | HR 62 | Ht 73.0 in | Wt 188.6 lb

## 2023-04-22 DIAGNOSIS — I10 Essential (primary) hypertension: Secondary | ICD-10-CM

## 2023-04-22 DIAGNOSIS — G4733 Obstructive sleep apnea (adult) (pediatric): Secondary | ICD-10-CM | POA: Diagnosis not present

## 2023-04-22 DIAGNOSIS — I351 Nonrheumatic aortic (valve) insufficiency: Secondary | ICD-10-CM | POA: Diagnosis not present

## 2023-04-22 DIAGNOSIS — I2510 Atherosclerotic heart disease of native coronary artery without angina pectoris: Secondary | ICD-10-CM | POA: Diagnosis not present

## 2023-04-22 NOTE — Progress Notes (Signed)
Chief Complaint  Patient presents with   Follow-up    CAD/AI   History of Present Illness: 52 yo male with a history of anxiety, sleep apnea, hyperlipidemia, aortic insufficency and HTN who is here today for cardiac follow up. He was initially seen in our office November 2009 for an abnormal EKG. At that time he denied having any chest pain, but did complain of occasional shortness of breath when he was having episodes of anxiety. Echo was overall normal with mild AI. Echo June 2016 with normal LV systolic function, trivial AI. Echo December 2022 with LVEF=60-65%. Mild AI. He was seen in our office in July 2024 with c/o chest pain. Coronary CTA in August 2024 with minimal plaque in the LAD. Coronary calcium score of 90.   He is here today for follow up. The patient denies any chest pain, dyspnea, palpitations, lower extremity edema, orthopnea, PND, dizziness, near syncope or syncope.    Primary Care Physician: Wanda Plump, MD   Past Medical History:  Diagnosis Date   ABNORMAL ECHOCARDIOGRAM 06/15/2009   Qualifier: Diagnosis of  By: Clifton James, MD, Jedadiah Abdallah     Abnormal EKG    LVH, iRBB, sees cards    Anxiety    sees [name]   ANXIETY 05/19/2008   Qualifier: Diagnosis of  By: Drue Novel MD, Nolon Rod.    Backache 04/06/2008   Qualifier: Diagnosis of  By: Drue Novel MD, Nolon Rod.    Benign neoplasm of skin 06/05/2009   Qualifier: Diagnosis of  By: Drue Novel MD, Nolon Rod.    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    Hyperlipidemia    Hypertension    Multiple pigmented nevi    saw derm before    RBBB 06/15/2009   Qualifier: Diagnosis of  By: Meryl Crutch, RN, BSN, Doreatha Lew    Sleep apnea    WEARS CPAP    Throat pain 04/03/2017    Past Surgical History:  Procedure Laterality Date   FINGER SURGERY     AS A CHILD    TOOTH EXTRACTION     WITH SEDATION, NOT WISDOM TEETH   VASECTOMY  2004    Current  Outpatient Medications  Medication Sig Dispense Refill   aspirin EC 81 MG tablet Take 1 tablet (81 mg total) by mouth daily. Swallow whole.     atorvastatin (LIPITOR) 40 MG tablet Take 1 tablet (40 mg total) by mouth daily. 90 tablet 3   clonazePAM (KLONOPIN) 1 MG tablet Take 1 mg by mouth 3 (three) times daily as needed.     Coenzyme Q10 60 MG CAPS Take by mouth.     ezetimibe (ZETIA) 10 MG tablet Take 1 tablet (10 mg total) by mouth daily. 90 tablet 1   Multiple Vitamin (MULTIVITAMIN) tablet Take 1 tablet by mouth daily.     psyllium (METAMUCIL) 58.6 % powder 1 teaspoon once daily     sildenafil (REVATIO) 20 MG tablet Take 3-4 tablets (60-80 mg total) by mouth at bedtime as needed. 30 tablet 3   VYVANSE 50 MG capsule Take 50 mg by mouth every morning.     No current facility-administered medications for this visit.    No Known Allergies  Social History   Socioeconomic History   Marital status: Married    Spouse name: Not on file   Number of children: 2   Years of education: Not on  file   Highest education level: Not on file  Occupational History   Occupation: works for a Clinical research associate: [employer]  Tobacco Use   Smoking status: Never   Smokeless tobacco: Never  Vaping Use   Vaping status: Never Used  Substance and Sexual Activity   Alcohol use: Yes    Alcohol/week: 3.0 standard drinks of alcohol    Types: 3 Glasses of wine per week    Comment: socially    Drug use: No   Sexual activity: Yes  Other Topics Concern   Not on file  Social History Narrative   Household: pt wife    2 children: 2001 2003 both in [employer]       Social Determinants of Health   Financial Resource Strain: Not on file  Food Insecurity: Not on file  Transportation Needs: Not on file  Physical Activity: Not on file  Stress: Not on file  Social Connections: Not on file  Intimate Partner Violence: Not on file    Family History  Problem Relation Age of Onset   Colon polyps Father         patient believes father may have had around age 90   Stroke Father    Atrial fibrillation Father    Heart disease Mother        11s   Breast cancer Mother        breast   Mitral valve prolapse Mother        s/p heart transplant after MI 3-14   Colon cancer Other        grandmother and grandfather   Colon cancer Paternal Grandmother    Colon cancer Paternal Grandfather    Cancer - Prostate Neg Hx    Diabetes Neg Hx    Esophageal cancer Neg Hx    Rectal cancer Neg Hx    Stomach cancer Neg Hx     Review of Systems:  As stated in the HPI and otherwise negative.   BP 114/80   Pulse 62   Ht 6\' 1"  (1.854 m)   Wt 85.5 kg   SpO2 99%   BMI 24.88 kg/m   Physical Examination:  General: Well developed, well nourished, NAD  HEENT: OP clear, mucus membranes moist  SKIN: warm, dry. No rashes. Neuro: No focal deficits  Musculoskeletal: Muscle strength 5/5 all ext  Psychiatric: Mood and affect normal  Neck: No JVD, no carotid bruits, no thyromegaly, no lymphadenopathy.  Lungs:Clear bilaterally, no wheezes, rhonci, crackles Cardiovascular: Regular rate and rhythm. No murmurs, gallops or rubs. Abdomen:Soft. Bowel sounds present. Non-tender.  Extremities: No lower extremity edema. Pulses are 2 + in the bilateral DP/PT.  EKG:  EKG is ordered today. The ekg ordered today demonstrates  NSR with incomplete RBBB   Recent Labs: 10/08/2022: Hemoglobin 14.5; Platelets 218.0; TSH 2.26 02/06/2023: BUN 10; Creatinine, Ser 0.89; Potassium 4.4; Sodium 145 02/19/2023: ALT 42   Lipid Panel    Component Value Date/Time   CHOL 143 10/08/2022 1049   CHOL 175 11/09/2021 1006   TRIG 45.0 10/08/2022 1049   HDL 80.80 10/08/2022 1049   HDL 96 11/09/2021 1006   CHOLHDL 2 10/08/2022 1049   VLDL 9.0 10/08/2022 1049   LDLCALC 53 10/08/2022 1049   LDLCALC 66 11/09/2021 1006   LDLDIRECT 171.4 10/08/2012 1124     Wt Readings from Last 3 Encounters:  04/22/23 85.5 kg  02/19/23 87.8 kg  02/06/23  88.9 kg    Assessment and Plan:  1. CAD without angina: Mild CAD by coronary CTA in August 2024. Continue ASA and statin.   2. HYPERTENSION: BP is well controlled. No changes  3. Aortic insufficiency: Mild by echo in 2022. Will repeat echo in 2025.  4. Sleep apnea: He is on CPAP  Labs/ tests ordered today include:   Orders Placed This Encounter  Procedures   EKG 12-Lead   ECHOCARDIOGRAM COMPLETE   Disposition:   F/U with me in 12 months.   Signed, Verne Carrow, MD 04/22/2023 3:50 PM    [facility] [address] [place], [place], [place]  [address] Phone: [phone]; Fax: [phone]

## 2023-04-22 NOTE — Patient Instructions (Signed)
Medication Instructions:  No changes *If you need a refill on your cardiac medications before your next appointment, please call your pharmacy*   Lab Work: none If you have labs (blood work) drawn today and your tests are completely normal, you will receive your results only by: MyChart Message (if you have MyChart) OR A paper copy in the mail If you have any lab test that is abnormal or we need to change your treatment, we will call you to review the results.   Testing/Procedures: ECHO DUE NOV/DEC 2025 Your physician has requested that you have an echocardiogram. Echocardiography is a painless test that uses sound waves to create images of your heart. It provides your doctor with information about the size and shape of your heart and how well your heart's chambers and valves are working. This procedure takes approximately one hour. There are no restrictions for this procedure. Please do NOT wear cologne, perfume, aftershave, or lotions (deodorant is allowed). Please arrive 15 minutes prior to your appointment time.    Follow-Up: At [facility], you and your health needs are our priority.  As part of our continuing mission to provide you with exceptional heart care, we have created designated Provider Care Teams.  These Care Teams include your primary Cardiologist (physician) and Advanced Practice Providers (APPs -  Physician Assistants and Nurse Practitioners) who all work together to provide you with the care you need, when you need it.  We recommend signing up for the patient portal called "MyChart".  Sign up information is provided on this After Visit Summary.  MyChart is used to connect with patients for Virtual Visits (Telemedicine).  Patients are able to view lab/test results, encounter notes, upcoming appointments, etc.  Non-urgent messages can be sent to your provider as well.   To learn more about what you can do with MyChart, go to [email or web address].    Your next  appointment:   15-18 months ---JAN-MAR 2026  Provider:   Verne Carrow, MD

## 2023-04-23 ENCOUNTER — Other Ambulatory Visit (INDEPENDENT_AMBULATORY_CARE_PROVIDER_SITE_OTHER): Payer: 59

## 2023-04-23 DIAGNOSIS — R7989 Other specified abnormal findings of blood chemistry: Secondary | ICD-10-CM

## 2023-04-23 LAB — HEPATIC FUNCTION PANEL
ALT: 41 U/L (ref 0–53)
AST: 26 U/L (ref 0–37)
Albumin: 4.6 g/dL (ref 3.5–5.2)
Alkaline Phosphatase: 74 U/L (ref 39–117)
Bilirubin, Direct: 0.2 mg/dL (ref 0.0–0.3)
Total Bilirubin: 0.7 mg/dL (ref 0.2–1.2)
Total Protein: 6.1 g/dL (ref 6.0–8.3)

## 2023-04-24 LAB — HEPATITIS B SURFACE ANTIGEN: Hepatitis B Surface Ag: NONREACTIVE

## 2023-04-24 LAB — HEPATITIS B CORE ANTIBODY, TOTAL: Hep B Core Total Ab: NONREACTIVE

## 2023-04-24 LAB — HEPATITIS C ANTIBODY: Hepatitis C Ab: NONREACTIVE

## 2023-04-24 LAB — HEPATITIS B SURFACE ANTIBODY,QUALITATIVE: Hep B S Ab: NONREACTIVE

## 2023-07-26 ENCOUNTER — Other Ambulatory Visit: Payer: Self-pay | Admitting: Cardiology

## 2023-09-19 ENCOUNTER — Encounter: Payer: Self-pay | Admitting: Internal Medicine

## 2023-09-19 ENCOUNTER — Other Ambulatory Visit: Payer: Self-pay | Admitting: Internal Medicine

## 2023-09-19 ENCOUNTER — Ambulatory Visit: Payer: 59 | Admitting: Internal Medicine

## 2023-09-19 VITALS — BP 132/86 | HR 60 | Temp 98.1°F | Resp 16 | Ht 73.0 in | Wt 180.5 lb

## 2023-09-19 DIAGNOSIS — Z125 Encounter for screening for malignant neoplasm of prostate: Secondary | ICD-10-CM

## 2023-09-19 DIAGNOSIS — Z0000 Encounter for general adult medical examination without abnormal findings: Secondary | ICD-10-CM | POA: Diagnosis not present

## 2023-09-19 DIAGNOSIS — E785 Hyperlipidemia, unspecified: Secondary | ICD-10-CM | POA: Diagnosis not present

## 2023-09-19 DIAGNOSIS — I2583 Coronary atherosclerosis due to lipid rich plaque: Secondary | ICD-10-CM | POA: Diagnosis not present

## 2023-09-19 DIAGNOSIS — I2510 Atherosclerotic heart disease of native coronary artery without angina pectoris: Secondary | ICD-10-CM | POA: Diagnosis not present

## 2023-09-19 DIAGNOSIS — M545 Low back pain, unspecified: Secondary | ICD-10-CM

## 2023-09-19 DIAGNOSIS — I10 Essential (primary) hypertension: Secondary | ICD-10-CM | POA: Diagnosis not present

## 2023-09-19 LAB — BASIC METABOLIC PANEL
BUN: 25 mg/dL — ABNORMAL HIGH (ref 6–23)
CO2: 30 meq/L (ref 19–32)
Calcium: 9.5 mg/dL (ref 8.4–10.5)
Chloride: 103 meq/L (ref 96–112)
Creatinine, Ser: 1.09 mg/dL (ref 0.40–1.50)
GFR: 78.15 mL/min (ref >60.00)
Glucose, Bld: 108 mg/dL — ABNORMAL HIGH (ref 70–99)
Potassium: 4.1 meq/L (ref 3.5–5.1)
Sodium: 140 meq/L (ref 135–145)

## 2023-09-19 LAB — PSA: PSA: 0.97 ng/mL (ref 0.10–4.00)

## 2023-09-19 LAB — CBC WITH DIFFERENTIAL/PLATELET
Basophils Absolute: 0 10*3/uL (ref 0.0–0.1)
Basophils Relative: 0.6 % (ref 0.0–3.0)
Eosinophils Absolute: 0.1 10*3/uL (ref 0.0–0.7)
Eosinophils Relative: 1.1 % (ref 0.0–5.0)
HCT: 40.4 % (ref 39.0–52.0)
Hemoglobin: 13.6 g/dL (ref 13.0–17.0)
Lymphocytes Relative: 26.5 % (ref 12.0–46.0)
Lymphs Abs: 1.5 10*3/uL (ref 0.7–4.0)
MCHC: 33.6 g/dL (ref 30.0–36.0)
MCV: 91.3 fl (ref 78.0–100.0)
Monocytes Absolute: 0.4 10*3/uL (ref 0.1–1.0)
Monocytes Relative: 7.2 % (ref 3.0–12.0)
Neutro Abs: 3.5 10*3/uL (ref 1.4–7.7)
Neutrophils Relative %: 64.6 % (ref 43.0–77.0)
Platelets: 167 10*3/uL (ref 150.0–400.0)
RBC: 4.43 Mil/uL (ref 4.22–5.81)
RDW: 13.4 % (ref 11.5–15.5)
WBC: 5.5 10*3/uL (ref 4.0–10.5)

## 2023-09-19 LAB — ALT: ALT: 36 U/L (ref 0–53)

## 2023-09-19 LAB — LIPID PANEL
Cholesterol: 131 mg/dL (ref 0–200)
HDL: 58.4 mg/dL (ref >39.00)
LDL Cholesterol: 63 mg/dL (ref 0–99)
NonHDL: 72.1
Total CHOL/HDL Ratio: 2
Triglycerides: 47 mg/dL (ref 0.0–149.0)
VLDL: 9.4 mg/dL (ref 0.0–40.0)

## 2023-09-19 LAB — HEMOGLOBIN A1C: Hgb A1c MFr Bld: 5.4 % (ref 4.6–6.5)

## 2023-09-19 LAB — AST: AST: 27 U/L (ref 0–37)

## 2023-09-19 NOTE — Patient Instructions (Addendum)
 We are referring you to Dr. Lavada Mesi for your back pain.  Check the  blood pressure regularly Blood pressure goal:  between 110/65 and  135/85. If it is consistently higher or lower, let me know     GO TO THE LAB : Get the blood work     Please go to the front desk: Arrange for physical exam in 1 year.       "Health Care Power of attorney" (Also know as a  "Living will" or  Advance care planning documents)  If you already have a living will or healthcare power of attorney, is recommended you bring the copy to be scanned in your chart.   The document will be available to all the doctors you see in the system.  If you are over 53 y/o and don't have the document, please read:  Advance care planning is a process that supports adults in  understanding and sharing their preferences regarding future medical care.  The patient's preferences are recorded in documents called Advance Directives and the can be modified at any time while the patient is in full mental capacity.     More information at: [email or web address]

## 2023-09-19 NOTE — Progress Notes (Signed)
 Subjective:    Patient ID: Troy Leonard, male    DOB: Feb 03, 1971, 53 y.o.   MRN: [id number]  DOS:  09/19/2023 Type of visit - description: Here for CPX  Here for CPX, chronic medical problems addressed.  He is feeling great except for back pain, this is going on for years, at time he is a sharp pain. He is stiff in the morning. Stretching and yoga help but not consistently   Review of Systems  Other than above, a 14 point review of systems is negative     Past Medical History:  Diagnosis Date   ABNORMAL ECHOCARDIOGRAM 06/15/2009   Qualifier: Diagnosis of  By: Clifton James, MD, Christopher     Abnormal EKG    LVH, iRBB, sees cards    Anxiety    sees [name]   ANXIETY 05/19/2008   Qualifier: Diagnosis of  By: Drue Novel MD, Nolon Rod.    Backache 04/06/2008   Qualifier: Diagnosis of  By: Drue Novel MD, Nolon Rod.    Benign neoplasm of skin 06/05/2009   Qualifier: Diagnosis of  By: Drue Novel MD, Nolon Rod.    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    Hyperlipidemia    Hypertension    Multiple pigmented nevi    saw derm before    RBBB 06/15/2009   Qualifier: Diagnosis of  By: Meryl Crutch, RN, BSN, Doreatha Lew    Sleep apnea    WEARS CPAP    Throat pain 04/03/2017    Past Surgical History:  Procedure Laterality Date   FINGER SURGERY     AS A CHILD    TOOTH EXTRACTION     WITH SEDATION, NOT WISDOM TEETH   VASECTOMY  2004   Social History   Socioeconomic History   Marital status: Married    Spouse name: Not on file   Number of children: 2   Years of education: Not on file   Highest education level: Not on file  Occupational History   Occupation: works remotely  Tobacco Use   Smoking status: Never   Smokeless tobacco: Never  Vaping Use   Vaping status: Never Used  Substance and Sexual Activity   Alcohol use: Yes    Alcohol/week: 3.0 standard drinks of alcohol    Types: 3 Glasses of wine per week     Comment: socially    Drug use: No   Sexual activity: Yes  Other Topics Concern   Not on file  Social History Narrative   Household: pt wife    2 children:    Son- Engineer, manufacturing- college       Social Drivers of Corporate investment banker Strain: Not on file  Food Insecurity: Not on file  Transportation Needs: Not on file  Physical Activity: Not on file  Stress: Not on file  Social Connections: Not on file  Intimate Partner Violence: Not on file    Current Outpatient Medications  Medication Instructions   aspirin EC 81 mg, Oral, Daily, Swallow whole.   atorvastatin (LIPITOR) 40 mg, Oral, Daily   clonazePAM (KLONOPIN) 1 mg, 3 times daily PRN   Coenzyme Q10 60 MG CAPS Take by mouth.   ezetimibe (ZETIA) 10 mg, Oral, Daily   Multiple Vitamin (MULTIVITAMIN) tablet 1 tablet, Daily   psyllium (METAMUCIL) 58.6 % powder 1 teaspoon once daily   sildenafil (REVATIO) 60-80 mg, Oral, At  bedtime PRN   Vyvanse 50 mg, Every morning       Objective:   Physical Exam BP 132/86   Pulse 60   Temp 98.1 F (36.7 C) (Oral)   Resp 16   Ht 6\' 1"  (1.854 m)   Wt 180 lb 8 oz (81.9 kg)   SpO2 97%   BMI 23.81 kg/m  General: Well developed, NAD, BMI noted Neck: No  thyromegaly  HEENT:  Normocephalic . Face symmetric, atraumatic Lungs:  CTA B Normal respiratory effort, no intercostal retractions, no accessory muscle use. Heart: RRR,  no murmur.  Abdomen:  Not distended, soft, non-tender. No rebound or rigidity.   Lower extremities: no pretibial edema bilaterally  Skin: Exposed areas without rash. Not pale. Not jaundice Neurologic:  alert & oriented X3.  Speech normal, gait appropriate for age and unassisted Strength symmetric and appropriate for age.  Psych: Cognition and judgment appear intact.  Cooperative with normal attention span and concentration.  Behavior appropriate. No anxious or depressed appearing.      Assessment    Assessment HTN - on no meds as of  12-2015 Hyperlipidemia CV: + FH mother age ~ 56 ---RBBB, see cards ---Trivial Ao insufficiency  by echo,  echo 12-2014 stable, echo 06-2021 ---Ca+ Coronary score 06/2021: 49.   --- CTA morphology 2024, saw cardiology, Rx aspirin and continue statins. OSA on CPAP dx 12/2019 Anxiety,ADD  sees Dr. Kirke Shaggy (clonazepam) Multiple nevus, sees derm   PLAN: Here for CPX - Td: 2022 - s/p  Shingrix x 2 per pt - Had a flu and COVID-vaccine. --CCS: cscope 11-2020, next 11-2027 per GI letter  -Prostate cancer screening: No symptoms,  check PSA - Diet exercise: Recommend to continue staying active. --Labs: BMP AST ALT FLP CBC A1c PSA Will also address the following. HTN: Ambulatory BPs 120 over 60s, on lifestyle controlled. Hyperlipidemia: On atorvastatin, Zetia, checking FLP AST ALT. CAD Cardiology OV 04/22/2023, no meds changes made.  Continue aspirin and cholesterol management.  Check CBC. Fatigue, ADD: Managed elsewhere, he seems to be doing very well emotionally today. RTC 1 year

## 2023-09-20 ENCOUNTER — Encounter: Payer: Self-pay | Admitting: Internal Medicine

## 2023-09-20 NOTE — Assessment & Plan Note (Signed)
 Here for CPX  Will also address the following. HTN: Ambulatory BPs 120 over 60s, on lifestyle controlled. Hyperlipidemia: On atorvastatin, Zetia, checking FLP AST ALT. CAD Cardiology OV 04/22/2023, no meds changes made.  Continue aspirin and cholesterol management.  Check CBC. Fatigue, ADD: Managed elsewhere, he seems to be doing very well emotionally today. RTC 1 year

## 2023-09-20 NOTE — Assessment & Plan Note (Signed)
 Here for CPX - Td: 2022 - s/p  Shingrix x 2 per pt - Had a flu and COVID-vaccine. --CCS: cscope 11-2020, next 11-2027 per GI letter  -Prostate cancer screening: No symptoms,  check PSA - Diet exercise: Recommend to continue staying active. --Labs: BMP AST ALT FLP CBC A1c PSA

## 2023-09-21 ENCOUNTER — Encounter: Payer: Self-pay | Admitting: Internal Medicine

## 2024-01-11 ENCOUNTER — Other Ambulatory Visit: Payer: Self-pay | Admitting: Internal Medicine

## 2024-02-15 ENCOUNTER — Other Ambulatory Visit: Payer: Self-pay | Admitting: Cardiovascular Disease

## 2024-05-14 DIAGNOSIS — M545 Low back pain, unspecified: Secondary | ICD-10-CM

## 2024-05-27 ENCOUNTER — Ambulatory Visit
Admission: EM | Admit: 2024-05-27 | Discharge: 2024-05-27 | Disposition: A | Discharge status: Home / Self Care | Attending: Family Medicine | Admitting: Family Medicine

## 2024-05-27 DIAGNOSIS — J069 Acute upper respiratory infection, unspecified: Secondary | ICD-10-CM

## 2024-05-27 DIAGNOSIS — B9689 Other specified bacterial agents as the cause of diseases classified elsewhere: Secondary | ICD-10-CM | POA: Diagnosis not present

## 2024-05-27 MED ORDER — PROMETHAZINE-DM 6.25-15 MG/5ML PO SYRP: 5.0000 mL | ORAL_SOLUTION | Freq: Three times a day (TID) | ORAL | 0 refills | Status: AC | PRN

## 2024-05-27 MED ORDER — AMOXICILLIN-POT CLAVULANATE 875-125 MG PO TABS: 1.0000 | ORAL_TABLET | Freq: Two times a day (BID) | ORAL | 0 refills | Status: AC

## 2024-05-27 MED ORDER — PSEUDOEPHEDRINE HCL 30 MG PO TABS: 30.0000 mg | ORAL_TABLET | Freq: Three times a day (TID) | ORAL | 0 refills | Status: AC | PRN

## 2024-05-27 MED ORDER — CETIRIZINE HCL 10 MG PO TABS: 10.0000 mg | ORAL_TABLET | Freq: Every day | ORAL | 0 refills | Status: AC

## 2024-05-27 NOTE — ED Provider Notes (Signed)
 [facility]  Note:  This document was prepared using Conservation officer, historic buildings and may include unintentional dictation errors.  MRN: [id number] DOB: 1971-04-13  Subjective:   Troy Leonard is a 53 y.o. male presenting for 1 week history of persistent sinus congestion, chest congestion, productive cough, sinus drainage, malaise and fatigue.  Has worsened dramatically in the past 4 days.  No overt chest pain, shortness of breath or wheezing.  No fevers.  No body pains.  No asthma.  No smoking of any kind including cigarettes, cigars, vaping, marijuana use.  No history of arrhythmias.  No current facility-administered medications for this encounter.  Current Outpatient Medications:    ezetimibe  (ZETIA ) 10 MG tablet, Take 1 tablet (10 mg total) by mouth daily., Disp: 90 tablet, Rfl: 1   aspirin  EC 81 MG tablet, Take 1 tablet (81 mg total) by mouth daily. Swallow whole., Disp: , Rfl:    atorvastatin  (LIPITOR) 40 MG tablet, TAKE 1 TABLET BY MOUTH EVERY DAY, Disp: 90 tablet, Rfl: 0   clonazePAM (KLONOPIN) 1 MG tablet, Take 1 mg by mouth 3 (three) times daily as needed., Disp: , Rfl:    Coenzyme Q10 60 MG CAPS, Take by mouth., Disp: , Rfl:    Multiple Vitamin (MULTIVITAMIN) tablet, Take 1 tablet by mouth daily., Disp: , Rfl:    psyllium (METAMUCIL) 58.6 % powder, 1 teaspoon once daily, Disp: , Rfl:    sildenafil  (REVATIO ) 20 MG tablet, Take 3-4 tablets (60-80 mg total) by mouth at bedtime as needed., Disp: 30 tablet, Rfl: 3   VYVANSE 50 MG capsule, Take 50 mg by mouth every morning., Disp: , Rfl:    No Known Allergies  Past Medical History:  Diagnosis Date   ABNORMAL ECHOCARDIOGRAM 06/15/2009   Qualifier: Diagnosis of  By: Verlin, MD, Christopher     Abnormal EKG    LVH, iRBB, sees cards    Anxiety    sees Dr Blinda   ANXIETY 05/19/2008   Qualifier: Diagnosis of  By: Amon MD, Aloysius BRAVO.    Backache 04/06/2008   Qualifier: Diagnosis of  By: Amon MD, Aloysius BRAVO.     Benign neoplasm of skin 06/05/2009   Qualifier: Diagnosis of  By: Amon MD, Aloysius BRAVO.    Dyslipidemia 10/08/2012   Epididymal cyst 05/20/2011   Essential hypertension 05/19/2008   Qualifier: Diagnosis of  By: Amon MD, Aloysius BRAVO.    Hyperlipidemia    Hypertension    Multiple pigmented nevi    saw derm before    RBBB 06/15/2009   Qualifier: Diagnosis of  By: Velinda, RN, BSN, Avelina CROME    Sleep apnea    WEARS CPAP    Throat pain 04/03/2017     Past Surgical History:  Procedure Laterality Date   FINGER SURGERY     AS A CHILD    TOOTH EXTRACTION     WITH SEDATION, NOT WISDOM TEETH   VASECTOMY  2004    Family History  Problem Relation Age of Onset   Colon polyps Father        patient believes father may have had around age 51   Stroke Father    Atrial fibrillation Father    Heart disease Mother        24s   Breast cancer Mother        breast   Mitral valve prolapse Mother        s/p heart transplant after MI 3-14   Colon  cancer Other        grandmother and grandfather   Colon cancer Paternal Grandmother    Colon cancer Paternal Grandfather    Cancer - Prostate Neg Hx    Diabetes Neg Hx    Esophageal cancer Neg Hx    Rectal cancer Neg Hx    Stomach cancer Neg Hx     Social History   Tobacco Use   Smoking status: Never   Smokeless tobacco: Never  Vaping Use   Vaping status: Never Used  Substance Use Topics   Alcohol use: Yes    Alcohol/week: 3.0 standard drinks of alcohol    Types: 3 Glasses of wine per week    Comment: socially    Drug use: No    ROS   Objective:   Vitals: BP 117/84 (BP Location: Right Arm)   Pulse (!) 105   Temp 98.8 F (37.1 C) (Oral)   Resp 20   SpO2 96%   Physical Exam Constitutional:      General: He is not in acute distress.    Appearance: Normal appearance. He is well-developed and normal weight. He is not ill-appearing, toxic-appearing or diaphoretic.  HENT:     Head: Normocephalic and atraumatic.     Right Ear: Tympanic  membrane, ear canal and external ear normal. No drainage, swelling or tenderness. No middle ear effusion. There is no impacted cerumen. Tympanic membrane is not erythematous or bulging.     Left Ear: Tympanic membrane, ear canal and external ear normal. No drainage, swelling or tenderness.  No middle ear effusion. There is no impacted cerumen. Tympanic membrane is not erythematous or bulging.     Nose: Congestion present. No rhinorrhea.     Mouth/Throat:     Mouth: Mucous membranes are moist.     Pharynx: Posterior oropharyngeal erythema (with associated post-nasal drainage overlying pharynx) present. No oropharyngeal exudate.  Eyes:     General: No scleral icterus.       Right eye: No discharge.        Left eye: No discharge.     Extraocular Movements: Extraocular movements intact.     Conjunctiva/sclera: Conjunctivae normal.  Cardiovascular:     Rate and Rhythm: Normal rate and regular rhythm.     Heart sounds: Normal heart sounds. No murmur heard.    No friction rub. No gallop.  Pulmonary:     Effort: Pulmonary effort is normal. No respiratory distress.     Breath sounds: Normal breath sounds. No stridor. No wheezing, rhonchi or rales.  Musculoskeletal:     Cervical back: Normal range of motion and neck supple. No rigidity. No muscular tenderness.  Neurological:     General: No focal deficit present.     Mental Status: He is alert and oriented to person, place, and time.  Psychiatric:        Mood and Affect: Mood normal.        Behavior: Behavior normal.        Thought Content: Thought content normal.     Assessment and Plan :   PDMP not reviewed this encounter.  1. Bacterial upper respiratory infection    Will manage for bacterial upper respiratory infection with Augmentin.  Start Zyrtec and low-dose pseudoephedrine for supportive care.  Deferred imaging given clear cardiopulmonary exam, hemodynamically stable vital signs.  Counseled patient on potential for adverse effects  with medications prescribed/recommended today, ER and return-to-clinic precautions discussed, patient verbalized understanding.    Christopher Savannah, [place] 05/27/24 [phone]

## 2024-05-27 NOTE — ED Triage Notes (Signed)
 Pt c/o prod cough, head/chest congestion, chills, fatigue-sx started 1 week ago during air travel-worse x 4 days-taking OTC cold meds-NAD-steady gait

## 2024-05-27 NOTE — Discharge Instructions (Addendum)
 We will manage this as a bacterial upper respiratory infection with amoxicillin -clavulanate. For sore throat or cough try using a honey-based tea. Use 3 teaspoons of honey with juice squeezed from half lemon. Place shaved pieces of ginger into 1/2-1 cup of water and warm over stove top. Then mix the ingredients and repeat every 4 hours as needed. Please take ibuprofen 600mg  every 6 hours with food alternating with OR taken together with Tylenol 500mg -650mg  every 6 hours for throat pain, fevers, aches and pains. Hydrate very well with at least 2 liters of water. Eat light meals such as soups (chicken and noodles, vegetable, chicken and wild rice).  Do not eat foods that you are allergic to.  Taking an antihistamine like Zyrtec  can help against postnasal drainage, sinus congestion which can cause sinus pain, sinus headaches, throat pain, painful swallowing, coughing.  You can take this together with pseudoephedrine  (Sudafed) at a dose of 30 mg 3 times a day or twice daily as needed for the same kind of nasal drip, congestion.  Use cough medication as needed.

## 2024-05-31 ENCOUNTER — Ambulatory Visit (HOSPITAL_COMMUNITY): Payer: 59

## 2024-07-11 ENCOUNTER — Other Ambulatory Visit: Payer: Self-pay | Admitting: Cardiovascular Disease

## 2024-08-09 ENCOUNTER — Ambulatory Visit (HOSPITAL_COMMUNITY)

## 2024-09-07 ENCOUNTER — Ambulatory Visit (HOSPITAL_COMMUNITY)

## 2024-09-24 ENCOUNTER — Encounter: Admitting: Internal Medicine
